# Patient Record
Sex: Female | Born: 2000 | Race: Black or African American | Hispanic: No | Marital: Single | Smoking: Never smoker
Health system: Southern US, Community
[De-identification: ages and names within clinical notes are randomized; demographics above are authoritative.]

## PROBLEM LIST (undated history)

## (undated) ENCOUNTER — Inpatient Hospital Stay (HOSPITAL_COMMUNITY): Payer: Self-pay

## (undated) DIAGNOSIS — Z789 Other specified health status: Secondary | ICD-10-CM

## (undated) HISTORY — DX: Other specified health status: Z78.9

---
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
## 2018-01-10 ENCOUNTER — Ambulatory Visit (INDEPENDENT_AMBULATORY_CARE_PROVIDER_SITE_OTHER): Payer: BLUE CROSS/BLUE SHIELD | Admitting: Obstetrics and Gynecology

## 2018-01-10 VITALS — BP 123/85 | HR 98 | Wt 193.5 lb

## 2018-01-10 DIAGNOSIS — Z3480 Encounter for supervision of other normal pregnancy, unspecified trimester: Secondary | ICD-10-CM

## 2018-01-10 DIAGNOSIS — Z23 Encounter for immunization: Secondary | ICD-10-CM

## 2018-01-10 DIAGNOSIS — Z3483 Encounter for supervision of other normal pregnancy, third trimester: Secondary | ICD-10-CM

## 2018-01-10 NOTE — Progress Notes (Signed)
   PRENATAL VISIT NOTE  Subjective:  Wendy Aguilar is a 17 y.o. G2P1001 at 7378w3d being seen today for ongoing prenatal care.  She is currently monitored for the following issues for this low-risk pregnancy and has Supervision of other normal pregnancy, antepartum; History of cesarean section; Encounter for fetal anatomic survey; and Late prenatal care affecting pregnancy in second trimester on their problem list.  Patient reports no complaints.  Contractions: Irritability. Vag. Bleeding: None.  Movement: Present. Denies leaking of fluid.   The following portions of the patient's history were reviewed and updated as appropriate: allergies, current medications, past family history, past medical history, past social history, past surgical history and problem list. Problem list updated.  Objective:   Vitals:   01/10/18 1554  BP: 123/85  Pulse: 98  Weight: 193 lb 8 oz (87.8 kg)    Fetal Status: Fetal Heart Rate (bpm): 140   Movement: Present     General:  Alert, oriented and cooperative. Patient is in no acute distress.  Skin: Skin is warm and dry. No rash noted.   Cardiovascular: Normal heart rate noted  Respiratory: Normal respiratory effort, no problems with respiration noted  Abdomen: Soft, gravid, appropriate for gestational age.  Pain/Pressure: Present     Pelvic: Cervical exam deferred        Extremities: Normal range of motion.  Edema: None  Mental Status: Normal mood and affect. Normal behavior. Normal judgment and thought content.   Assessment and Plan:  Pregnancy: G2P1001 at 1378w3d  1. Supervision of other normal pregnancy, antepartum  - Doing well today   2. Need for tetanus, diphtheria, and acellular pertussis (Tdap) vaccine in patient of adolescent age or older  - Tdap vaccine greater than or equal to 7yo IM  There are no diagnoses linked to this encounter. Preterm labor symptoms and general obstetric precautions including but not limited to vaginal bleeding,  contractions, leaking of fluid and fetal movement were reviewed in detail with the patient. Please refer to After Visit Summary for other counseling recommendations.  No follow-ups on file.  Future Appointments  Date Time Provider Department Center  01/24/2018  8:15 AM Marvetta GibbonsBurleson, Brand [facility]aleserri L, NP [facility]    Venia CarbonJennifer Rasch, NP

## 2018-01-10 NOTE — Progress Notes (Signed)
CSW A. Linton Rump met privately with Mob to discuss parenting, social and contraception options. MOB reports family is very supportive. MOB reports she currently employed and intends to work up till her due date. CSW A. Mohmmad Saleeby arranged [place] appt for MOB for 01/18/18 at [place]. Mother intends to use OCP as contraception option. CSW provided information on PP IUD and nexaplon

## 2018-01-10 NOTE — Progress Notes (Signed)
   PRENATAL VISIT NOTE  Subjective:  Wendy Aguilar is a 17 y.o. G2P1001 at 7965w3d being seen today for ongoing prenatal care.  She is currently monitored for the following issues for this low-risk pregnancy and has Supervision of other normal pregnancy, antepartum; History of cesarean section; Encounter for fetal anatomic survey; and Late prenatal care affecting pregnancy in second trimester on their problem list.  Patient reports lower abdominal pressure. She confirms frequent fetal activity. She denies any lower or upper abdominal pain, cramping, vaginal discharge or bleeding. Pt. Denies denies episodes of syncope, shortness of breath, chest pain, or palpitations.  Contractions: Irritability. Vag. Bleeding: None.  Movement: Present. Denies leaking of fluid.   The following portions of the patient's history were reviewed and updated as appropriate: allergies, current medications, past family history, past medical history, past social history, past surgical history and problem list. Problem list updated.  Objective:   Vitals:   01/10/18 1554  BP: 123/85  Pulse: 98  Weight: 193 lb 8 oz (87.8 kg)    Fetal Status: Fetal Heart Rate (bpm): 140 Fundal Height: 33 cm(Simultaneous filing. User may not have seen previous data.) Movement: Present     General:  Alert, oriented and cooperative. Patient is in no acute distress.  Skin: Skin is warm and dry. No rash noted.   Cardiovascular: Irregular BP noted, exacerbated during inhalation.  Respiratory: Normal respiratory effort, no problems with respiration noted  Abdomen: Soft, gravid, appropriate for gestational age.  Pain/Pressure: Present     Pelvic: Cervical exam deferred        Extremities: Normal range of motion.  Edema: None  Mental Status: Normal mood and affect. Normal behavior. Normal judgment and thought content.   Assessment and Plan:  Pregnancy: G2P1001 at 5665w3d  1. Supervision of other normal pregnancy, antepartum   2. Need  for tetanus, diphtheria, and acellular pertussis (Tdap) vaccine in patient of adolescent age or older  - Tdap vaccine greater than or equal to 7yo IM  Preterm labor symptoms and general obstetric precautions including but not limited to vaginal bleeding, contractions, leaking of fluid and fetal movement were reviewed in detail with the patient. Please refer to After Visit Summary for other counseling recommendations.  Return in about 2 weeks (around 01/24/2018).  Future Appointments  Date Time Provider Department Center  01/24/2018  8:15 AM Burleson, Brand Maleserri L, NP [facility]    Venia CarbonJennifer Rasch, NP   FNP student

## 2018-01-10 NOTE — Patient Instructions (Signed)
Tdap Vaccine (Tetanus, Diphtheria and Pertussis): What You Need to Know 1. Why get vaccinated? Tetanus, diphtheria and pertussis are very serious diseases. Tdap vaccine can protect us from these diseases. And, Tdap vaccine given to pregnant women can protect newborn babies against pertussis. TETANUS (Lockjaw) is rare in the [place] today. It causes painful muscle tightening and stiffness, usually all over the body.  It can lead to tightening of muscles in the head and neck so you can't open your mouth, swallow, or sometimes even breathe. Tetanus kills about 1 out of 10 people who are infected even after receiving the best medical care.  DIPHTHERIA is also rare in the [place] today. It can cause a thick coating to form in the back of the throat.  It can lead to breathing problems, heart failure, paralysis, and death.  PERTUSSIS (Whooping Cough) causes severe coughing spells, which can cause difficulty breathing, vomiting and disturbed sleep.  It can also lead to weight loss, incontinence, and rib fractures. Up to 2 in 100 adolescents and 5 in 100 adults with pertussis are hospitalized or have complications, which could include pneumonia or death.  These diseases are caused by bacteria. Diphtheria and pertussis are spread from person to person through secretions from coughing or sneezing. Tetanus enters the body through cuts, scratches, or wounds. Before vaccines, as many as 200,000 cases of diphtheria, 200,000 cases of pertussis, and hundreds of cases of tetanus, were reported in the [place] each year. Since vaccination began, reports of cases for tetanus and diphtheria have dropped by about 99% and for pertussis by about 80%. 2. Tdap vaccine Tdap vaccine can protect adolescents and adults from tetanus, diphtheria, and pertussis. One dose of Tdap is routinely given at age 11 or 12. People who did not get Tdap at that age should get it as soon as possible. Tdap is especially  important for healthcare professionals and anyone having close contact with a baby younger than 12 months. Pregnant women should get a dose of Tdap during every pregnancy, to protect the newborn from pertussis. Infants are most at risk for severe, life-threatening complications from pertussis. Another vaccine, called Td, protects against tetanus and diphtheria, but not pertussis. A Td booster should be given every 10 years. Tdap may be given as one of these boosters if you have never gotten Tdap before. Tdap may also be given after a severe cut or burn to prevent tetanus infection. Your doctor or the person giving you the vaccine can give you more information. Tdap may safely be given at the same time as other vaccines. 3. Some people should not get this vaccine  A person who has ever had a life-threatening allergic reaction after a previous dose of any diphtheria, tetanus or pertussis containing vaccine, OR has a severe allergy to any part of this vaccine, should not get Tdap vaccine. Tell the person giving the vaccine about any severe allergies.  Anyone who had coma or long repeated seizures within 7 days after a childhood dose of DTP or DTaP, or a previous dose of Tdap, should not get Tdap, unless a cause other than the vaccine was found. They can still get Td.  Talk to your doctor if you: ? have seizures or another nervous system problem, ? had severe pain or swelling after any vaccine containing diphtheria, tetanus or pertussis, ? ever had a condition called Guillain-Barr Syndrome (GBS), ? aren't feeling well on the day the shot is scheduled. 4. Risks With any medicine, including   vaccines, there is a chance of side effects. These are usually mild and go away on their own. Serious reactions are also possible but are rare. Most people who get Tdap vaccine do not have any problems with it. Mild problems following Tdap: (Did not interfere with activities)  Pain where the shot was given (about  3 in 4 adolescents or 2 in 3 adults)  Redness or swelling where the shot was given (about 1 person in 5)  Mild fever of at least 100.4F (up to about 1 in 25 adolescents or 1 in 100 adults)  Headache (about 3 or 4 people in 10)  Tiredness (about 1 person in 3 or 4)  Nausea, vomiting, diarrhea, stomach ache (up to 1 in 4 adolescents or 1 in 10 adults)  Chills, sore joints (about 1 person in 10)  Body aches (about 1 person in 3 or 4)  Rash, swollen glands (uncommon)  Moderate problems following Tdap: (Interfered with activities, but did not require medical attention)  Pain where the shot was given (up to 1 in 5 or 6)  Redness or swelling where the shot was given (up to about 1 in 16 adolescents or 1 in 12 adults)  Fever over 102F (about 1 in 100 adolescents or 1 in 250 adults)  Headache (about 1 in 7 adolescents or 1 in 10 adults)  Nausea, vomiting, diarrhea, stomach ache (up to 1 or 3 people in 100)  Swelling of the entire arm where the shot was given (up to about 1 in 500).  Severe problems following Tdap: (Unable to perform usual activities; required medical attention)  Swelling, severe pain, bleeding and redness in the arm where the shot was given (rare).  Problems that could happen after any vaccine:  People sometimes faint after a medical procedure, including vaccination. Sitting or lying down for about 15 minutes can help prevent fainting, and injuries caused by a fall. Tell your doctor if you feel dizzy, or have vision changes or ringing in the ears.  Some people get severe pain in the shoulder and have difficulty moving the arm where a shot was given. This happens very rarely.  Any medication can cause a severe allergic reaction. Such reactions from a vaccine are very rare, estimated at fewer than 1 in a million doses, and would happen within a few minutes to a few hours after the vaccination. As with any medicine, there is a very remote chance of a vaccine  causing a serious injury or death. The safety of vaccines is always being monitored. For more information, visit: www.cdc.gov/vaccinesafety/ 5. What if there is a serious problem? What should I look for? Look for anything that concerns you, such as signs of a severe allergic reaction, very high fever, or unusual behavior. Signs of a severe allergic reaction can include hives, swelling of the face and throat, difficulty breathing, a fast heartbeat, dizziness, and weakness. These would usually start a few minutes to a few hours after the vaccination. What should I do?  If you think it is a severe allergic reaction or other emergency that can't wait, call 9-1-1 or get the person to the nearest hospital. Otherwise, call your doctor.  Afterward, the reaction should be reported to the Vaccine Adverse Event Reporting System (VAERS). Your doctor might file this report, or you can do it yourself through the VAERS web site at www.vaers.hhs.gov, or by calling 1-800-822-7967. ? VAERS does not give medical advice. 6. The National Vaccine Injury Compensation Program The National   Vaccine Injury Compensation Program (VICP) is a federal program that was created to compensate people who may have been injured by certain vaccines. Persons who believe they may have been injured by a vaccine can learn about the program and about filing a claim by calling 1-800-338-2382 or visiting the VICP website at www.hrsa.gov/vaccinecompensation. There is a time limit to file a claim for compensation. 7. How can I learn more?  Ask your doctor. He or she can give you the vaccine package insert or suggest other sources of information.  Call your local or state health department.  Contact the Centers for Disease Control and Prevention (CDC): ? Call 1-800-232-4636 (1-800-CDC-INFO) or ? Visit CDC's website at www.cdc.gov/vaccines CDC Tdap Vaccine VIS (09/25/13) This information is not intended to replace advice given to you by your  health care provider. Make sure you discuss any questions you have with your health care provider. Document Released: 01/18/2012 Document Revised: 04/08/2016 Document Reviewed: 04/08/2016 Elsevier Interactive Patient Education  2017 Elsevier Inc.  

## 2018-01-24 ENCOUNTER — Encounter: Payer: BLUE CROSS/BLUE SHIELD | Admitting: Nurse Practitioner

## 2018-01-24 ENCOUNTER — Encounter: Payer: Self-pay | Admitting: Nurse Practitioner

## 2018-01-25 ENCOUNTER — Ambulatory Visit (INDEPENDENT_AMBULATORY_CARE_PROVIDER_SITE_OTHER): Payer: BLUE CROSS/BLUE SHIELD | Admitting: Medical

## 2018-01-25 ENCOUNTER — Encounter: Payer: Self-pay | Admitting: Medical

## 2018-01-25 VITALS — BP 115/61 | HR 79 | Wt 200.0 lb

## 2018-01-25 DIAGNOSIS — Z3480 Encounter for supervision of other normal pregnancy, unspecified trimester: Secondary | ICD-10-CM

## 2018-01-25 DIAGNOSIS — Z98891 History of uterine scar from previous surgery: Secondary | ICD-10-CM

## 2018-01-25 NOTE — Progress Notes (Signed)
   PRENATAL VISIT NOTE  Subjective:  Wendy Aguilar is a 17 y.o. G2P1001 at 4455w4d being seen today for ongoing prenatal care.  She is currently monitored for the following issues for this low-risk pregnancy and has Supervision of other normal pregnancy, antepartum; History of cesarean section; Encounter for fetal anatomic survey; and Late prenatal care affecting pregnancy in second trimester on their problem list.  Patient reports occasional contractions.  Contractions: Irritability. Vag. Bleeding: None.  Movement: Present. Denies leaking of fluid.   The following portions of the patient's history were reviewed and updated as appropriate: allergies, current medications, past family history, past medical history, past social history, past surgical history and problem list. Problem list updated.  Objective:   Vitals:   01/25/18 1627  BP: 115/61  Pulse: 79  Weight: 200 lb (90.7 kg)    Fetal Status: Fetal Heart Rate (bpm): 156 Fundal Height: 34 cm Movement: Present     General:  Alert, oriented and cooperative. Patient is in no acute distress.  Skin: Skin is warm and dry. No rash noted.   Cardiovascular: Normal heart rate noted  Respiratory: Normal respiratory effort, no problems with respiration noted  Abdomen: Soft, gravid, appropriate for gestational age.  Pain/Pressure: Present     Pelvic: Cervical exam deferred        Extremities: Normal range of motion.  Edema: None  Mental Status: Normal mood and affect. Normal behavior. Normal judgment and thought content.   Assessment and Plan:  Pregnancy: G2P1001 at 3755w4d  1. Supervision of other normal pregnancy, antepartum  2. History of cesarean section - Plan for TOLAC, consent signed  Preterm labor symptoms and general obstetric precautions including but not limited to vaginal bleeding, contractions, leaking of fluid and fetal movement were reviewed in detail with the patient. Please refer to After Visit Summary for other  counseling recommendations.  Return in about 2 weeks (around 02/08/2018) for LOB.  Vonzella NippleJulie Gerald Kuehl, PA-C

## 2018-01-25 NOTE — Patient Instructions (Signed)
Research childbirth classes and hospital preregistration at [email or web address]  Fetal Movement Counts Patient Name: ________________________________________________ Patient Due Date: ____________________ What is a fetal movement count? A fetal movement count is the number of times that you feel your baby move during a certain amount of time. This may also be called a fetal kick count. A fetal movement count is recommended for every pregnant woman. You may be asked to start counting fetal movements as early as week 28 of your pregnancy. Pay attention to when your baby is most active. You may notice your baby's sleep and wake cycles. You may also notice things that make your baby move more. You should do a fetal movement count:  When your baby is normally most active.  At the same time each day.  A good time to count movements is while you are resting, after having something to eat and drink. How do I count fetal movements? 1. Find a quiet, comfortable area. Sit, or lie down on your side. 2. Write down the date, the start time and stop time, and the number of movements that you felt between those two times. Take this information with you to your health care visits. 3. For 2 hours, count kicks, flutters, swishes, rolls, and jabs. You should feel at least 10 movements during 2 hours. 4. You may stop counting after you have felt 10 movements. 5. If you do not feel 10 movements in 2 hours, have something to eat and drink. Then, keep resting and counting for 1 hour. If you feel at least 4 movements during that hour, you may stop counting. Contact a health care provider if:  You feel fewer than 4 movements in 2 hours.  Your baby is not moving like he or she usually does. Date: ____________ Start time: ____________ Stop time: ____________ Movements: ____________ Date: ____________ Start time: ____________ Stop time: ____________ Movements: ____________ Date: ____________ Start time: ____________  Stop time: ____________ Movements: ____________ Date: ____________ Start time: ____________ Stop time: ____________ Movements: ____________ Date: ____________ Start time: ____________ Stop time: ____________ Movements: ____________ Date: ____________ Start time: ____________ Stop time: ____________ Movements: ____________ Date: ____________ Start time: ____________ Stop time: ____________ Movements: ____________ Date: ____________ Start time: ____________ Stop time: ____________ Movements: ____________ Date: ____________ Start time: ____________ Stop time: ____________ Movements: ____________ This information is not intended to replace advice given to you by your health care provider. Make sure you discuss any questions you have with your health care provider. Document Released: 08/18/2006 Document Revised: 03/17/2016 Document Reviewed: 08/28/2015 Elsevier Interactive Patient Education  2018 [employer]  Braxton Hicks Contractions Contractions of the uterus can occur throughout pregnancy, but they are not always a sign that you are in labor. You may have practice contractions called Braxton Hicks contractions. These false labor contractions are sometimes confused with true labor. What are Braxton Hicks contractions? Braxton Hicks contractions are tightening movements that occur in the muscles of the uterus before labor. Unlike true labor contractions, these contractions do not result in opening (dilation) and thinning of the cervix. Toward the end of pregnancy (32-34 weeks), Braxton Hicks contractions can happen more often and may become stronger. These contractions are sometimes difficult to tell apart from true labor because they can be very uncomfortable. You should not feel embarrassed if you go to the hospital with false labor. Sometimes, the only way to tell if you are in true labor is for your health care provider to look for changes in the cervix. The health care provider will   do a physical  exam and may monitor your contractions. If you are not in true labor, the exam should show that your cervix is not dilating and your water has not broken. If there are other health problems associated with your pregnancy, it is completely safe for you to be sent home with false labor. You may continue to have Braxton Hicks contractions until you go into true labor. How to tell the difference between true labor and false labor True labor  Contractions last 30-70 seconds.  Contractions become very regular.  Discomfort is usually felt in the top of the uterus, and it spreads to the lower abdomen and low back.  Contractions do not go away with walking.  Contractions usually become more intense and increase in frequency.  The cervix dilates and gets thinner. False labor  Contractions are usually shorter and not as strong as true labor contractions.  Contractions are usually irregular.  Contractions are often felt in the front of the lower abdomen and in the groin.  Contractions may go away when you walk around or change positions while lying down.  Contractions get weaker and are shorter-lasting as time goes on.  The cervix usually does not dilate or become thin. Follow these instructions at home:  Take over-the-counter and prescription medicines only as told by your health care provider.  Keep up with your usual exercises and follow other instructions from your health care provider.  Eat and drink lightly if you think you are going into labor.  If Braxton Hicks contractions are making you uncomfortable: ? Change your position from lying down or resting to walking, or change from walking to resting. ? Sit and rest in a tub of warm water. ? Drink enough fluid to keep your urine pale yellow. Dehydration may cause these contractions. ? Do slow and deep breathing several times an hour.  Keep all follow-up prenatal visits as told by your health care provider. This is  important. Contact a health care provider if:  You have a fever.  You have continuous pain in your abdomen. Get help right away if:  Your contractions become stronger, more regular, and closer together.  You have fluid leaking or gushing from your vagina.  You pass blood-tinged mucus (bloody show).  You have bleeding from your vagina.  You have low back pain that you never had before.  You feel your baby's head pushing down and causing pelvic pressure.  Your baby is not moving inside you as much as it used to. Summary  Contractions that occur before labor are called Braxton Hicks contractions, false labor, or practice contractions.  Braxton Hicks contractions are usually shorter, weaker, farther apart, and less regular than true labor contractions. True labor contractions usually become progressively stronger and regular and they become more frequent.  Manage discomfort from Braxton Hicks contractions by changing position, resting in a warm bath, drinking plenty of water, or practicing deep breathing. This information is not intended to replace advice given to you by your health care provider. Make sure you discuss any questions you have with your health care provider. Document Released: 12/02/2016 Document Revised: 12/02/2016 Document Reviewed: 12/02/2016 Elsevier Interactive Patient Education  2018 [employer] Inc.    

## 2018-02-08 ENCOUNTER — Inpatient Hospital Stay (HOSPITAL_COMMUNITY)
Admission: AD | Admit: 2018-02-08 | Discharge: 2018-02-08 | Disposition: A | Discharge status: Home / Self Care | Payer: BLUE CROSS/BLUE SHIELD | Source: Ambulatory Visit | Attending: Obstetrics and Gynecology | Admitting: Obstetrics and Gynecology

## 2018-02-08 ENCOUNTER — Telehealth: Payer: Self-pay | Admitting: Licensed Clinical Social Worker

## 2018-02-08 ENCOUNTER — Encounter (HOSPITAL_COMMUNITY): Payer: Self-pay | Admitting: *Deleted

## 2018-02-08 DIAGNOSIS — N898 Other specified noninflammatory disorders of vagina: Secondary | ICD-10-CM | POA: Insufficient documentation

## 2018-02-08 DIAGNOSIS — Z3A35 35 weeks gestation of pregnancy: Secondary | ICD-10-CM | POA: Diagnosis not present

## 2018-02-08 DIAGNOSIS — O26893 Other specified pregnancy related conditions, third trimester: Secondary | ICD-10-CM | POA: Diagnosis not present

## 2018-02-08 DIAGNOSIS — Z0371 Encounter for suspected problem with amniotic cavity and membrane ruled out: Secondary | ICD-10-CM

## 2018-02-08 LAB — URINALYSIS, ROUTINE W REFLEX MICROSCOPIC
BILIRUBIN URINE: NEGATIVE
Glucose, UA: NEGATIVE mg/dL
Hgb urine dipstick: NEGATIVE
KETONES UR: 20 mg/dL — AB
Leukocytes, UA: NEGATIVE
NITRITE: NEGATIVE
Protein, ur: NEGATIVE mg/dL
SPECIFIC GRAVITY, URINE: 1.017 (ref 1.005–1.030)
pH: 6 (ref 5.0–8.0)

## 2018-02-08 LAB — AMNISURE RUPTURE OF MEMBRANE (ROM) NOT AT ARMC: AMNISURE: NEGATIVE

## 2018-02-08 NOTE — Discharge Instructions (Signed)
Braxton Hicks Contractions °Contractions of the uterus can occur throughout pregnancy, but they are not always a sign that you are in labor. You may have practice contractions called Braxton Hicks contractions. These false labor contractions are sometimes confused with true labor. °What are Braxton Hicks contractions? °Braxton Hicks contractions are tightening movements that occur in the muscles of the uterus before labor. Unlike true labor contractions, these contractions do not result in opening (dilation) and thinning of the cervix. Toward the end of pregnancy (32-34 weeks), Braxton Hicks contractions can happen more often and may become stronger. These contractions are sometimes difficult to tell apart from true labor because they can be very uncomfortable. You should not feel embarrassed if you go to the hospital with false labor. °Sometimes, the only way to tell if you are in true labor is for your health care provider to look for changes in the cervix. The health care provider will do a physical exam and may monitor your contractions. If you are not in true labor, the exam should show that your cervix is not dilating and your water has not broken. °If there are other health problems associated with your pregnancy, it is completely safe for you to be sent home with false labor. You may continue to have Braxton Hicks contractions until you go into true labor. °How to tell the difference between true labor and false labor °True labor °· Contractions last 30-70 seconds. °· Contractions become very regular. °· Discomfort is usually felt in the top of the uterus, and it spreads to the lower abdomen and low back. °· Contractions do not go away with walking. °· Contractions usually become more intense and increase in frequency. °· The cervix dilates and gets thinner. °False labor °· Contractions are usually shorter and not as strong as true labor contractions. °· Contractions are usually irregular. °· Contractions  are often felt in the front of the lower abdomen and in the groin. °· Contractions may go away when you walk around or change positions while lying down. °· Contractions get weaker and are shorter-lasting as time goes on. °· The cervix usually does not dilate or become thin. °Follow these instructions at home: °· Take over-the-counter and prescription medicines only as told by your health care provider. °· Keep up with your usual exercises and follow other instructions from your health care provider. °· Eat and drink lightly if you think you are going into labor. °· If Braxton Hicks contractions are making you uncomfortable: °? Change your position from lying down or resting to walking, or change from walking to resting. °? Sit and rest in a tub of warm water. °? Drink enough fluid to keep your urine pale yellow. Dehydration may cause these contractions. °? Do slow and deep breathing several times an hour. °· Keep all follow-up prenatal visits as told by your health care provider. This is important. °Contact a health care provider if: °· You have a fever. °· You have continuous pain in your abdomen. °Get help right away if: °· Your contractions become stronger, more regular, and closer together. °· You have fluid leaking or gushing from your vagina. °· You pass blood-tinged mucus (bloody show). °· You have bleeding from your vagina. °· You have low back pain that you never had before. °· You feel your baby’s head pushing down and causing pelvic pressure. °· Your baby is not moving inside you as much as it used to. °Summary °· Contractions that occur before labor are called Braxton   Hicks contractions, false labor, or practice contractions. °· Braxton Hicks contractions are usually shorter, weaker, farther apart, and less regular than true labor contractions. True labor contractions usually become progressively stronger and regular and they become more frequent. °· Manage discomfort from Braxton Hicks contractions by  changing position, resting in a warm bath, drinking plenty of water, or practicing deep breathing. °This information is not intended to replace advice given to you by your health care provider. Make sure you discuss any questions you have with your health care provider. °Document Released: 12/02/2016 Document Revised: 12/02/2016 Document Reviewed: 12/02/2016 °Elsevier Interactive Patient Education © 2018 [employer] Inc. ° °Fetal Movement Counts °Patient Name: ________________________________________________ Patient Due Date: ____________________ °What is a fetal movement count? °A fetal movement count is the number of times that you feel your baby move during a certain amount of time. This may also be called a fetal kick count. A fetal movement count is recommended for every pregnant woman. You may be asked to start counting fetal movements as early as week 28 of your pregnancy. °Pay attention to when your baby is most active. You may notice your baby's sleep and wake cycles. You may also notice things that make your baby move more. You should do a fetal movement count: °· When your baby is normally most active. °· At the same time each day. ° °A good time to count movements is while you are resting, after having something to eat and drink. °How do I count fetal movements? °1. Find a quiet, comfortable area. Sit, or lie down on your side. °2. Write down the date, the start time and stop time, and the number of movements that you felt between those two times. Take this information with you to your health care visits. °3. For 2 hours, count kicks, flutters, swishes, rolls, and jabs. You should feel at least 10 movements during 2 hours. °4. You may stop counting after you have felt 10 movements. °5. If you do not feel 10 movements in 2 hours, have something to eat and drink. Then, keep resting and counting for 1 hour. If you feel at least 4 movements during that hour, you may stop counting. °Contact a health care  provider if: °· You feel fewer than 4 movements in 2 hours. °· Your baby is not moving like he or she usually does. °Date: ____________ Start time: ____________ Stop time: ____________ Movements: ____________ °Date: ____________ Start time: ____________ Stop time: ____________ Movements: ____________ °Date: ____________ Start time: ____________ Stop time: ____________ Movements: ____________ °Date: ____________ Start time: ____________ Stop time: ____________ Movements: ____________ °Date: ____________ Start time: ____________ Stop time: ____________ Movements: ____________ °Date: ____________ Start time: ____________ Stop time: ____________ Movements: ____________ °Date: ____________ Start time: ____________ Stop time: ____________ Movements: ____________ °Date: ____________ Start time: ____________ Stop time: ____________ Movements: ____________ °Date: ____________ Start time: ____________ Stop time: ____________ Movements: ____________ °This information is not intended to replace advice given to you by your health care provider. Make sure you discuss any questions you have with your health care provider. °Document Released: 08/18/2006 Document Revised: 03/17/2016 Document Reviewed: 08/28/2015 °Elsevier Interactive Patient Education © 2018 Else[employer] Inc. ° °

## 2018-02-08 NOTE — MAU Note (Signed)
Pt started having regular contractions while at work and when she went to the restroom she said she had some clear fluid come out.  States its not gushing but still feels a little bit.  Denies VB.

## 2018-02-08 NOTE — Telephone Encounter (Signed)
Phone busy.

## 2018-02-08 NOTE — MAU Provider Note (Addendum)
  S: Wendy Aguilar is a 17 y.o. G2P1001 at 3170w4d  who presents to [facility]U today complaining of leaking of fluid since this afternoon. She denies vaginal bleeding. She endorses occasional contractions. She reports normal fetal movement.    O: BP 129/66   Pulse 84   Temp 98.6 F (37 C) (Oral)   Resp 16   LMP 06/04/2017 (Approximate)   SpO2 100%  GENERAL: Well-developed, well-nourished female in no acute distress.  HEAD: Normocephalic, atraumatic.  CHEST: Normal effort of breathing, regular heart rate ABDOMEN: Soft, nontender, gravid   Cervical exam:  Dilation: Closed Effacement (%): 50 Station: Ballotable Exam by:: Judeth HornErin Kristilyn Coltrane, NP   Fetal Monitoring: Baseline: 135 Variability: moderate Accelerations: 15x15 Decelerations: none Contractions: irregular  Results for orders placed or performed during the hospital encounter of 02/08/18 (from the past 24 hour(s))  Urinalysis, Routine w reflex microscopic     Status: Abnormal   Collection Time: 02/08/18  7:25 PM  Result Value Ref Range   Color, Urine YELLOW YELLOW   APPearance CLEAR CLEAR   Specific Gravity, Urine 1.017 1.005 - 1.030   pH 6.0 5.0 - 8.0   Glucose, UA NEGATIVE NEGATIVE mg/dL   Hgb urine dipstick NEGATIVE NEGATIVE   Bilirubin Urine NEGATIVE NEGATIVE   Ketones, ur 20 (A) NEGATIVE mg/dL   Protein, ur NEGATIVE NEGATIVE mg/dL   Nitrite NEGATIVE NEGATIVE   Leukocytes, UA NEGATIVE NEGATIVE  [facility]     Status: None   Collection Time: 02/08/18  8:00 PM  Result Value Ref Range   Amnisure ROM NEGATIVE      A: SIUP at 6870w4d  Membranes intact  P: Discharge home  Keep ob appt tomorrow Discussed reasons to return to MAU  Judeth HornLawrence, Dejai Schubach, NP 02/08/2018 8:51 PM

## 2018-02-09 ENCOUNTER — Other Ambulatory Visit (HOSPITAL_COMMUNITY)
Admission: RE | Admit: 2018-02-09 | Discharge: 2018-02-09 | Disposition: A | Discharge status: Home / Self Care | Payer: BLUE CROSS/BLUE SHIELD | Source: Ambulatory Visit | Attending: Nurse Practitioner | Admitting: Nurse Practitioner

## 2018-02-09 ENCOUNTER — Ambulatory Visit (INDEPENDENT_AMBULATORY_CARE_PROVIDER_SITE_OTHER): Payer: BLUE CROSS/BLUE SHIELD | Admitting: Nurse Practitioner

## 2018-02-09 VITALS — BP 124/75 | HR 71 | Wt 204.2 lb

## 2018-02-09 DIAGNOSIS — Z3483 Encounter for supervision of other normal pregnancy, third trimester: Secondary | ICD-10-CM | POA: Diagnosis not present

## 2018-02-09 DIAGNOSIS — Z3480 Encounter for supervision of other normal pregnancy, unspecified trimester: Secondary | ICD-10-CM | POA: Diagnosis present

## 2018-02-09 DIAGNOSIS — Z98891 History of uterine scar from previous surgery: Secondary | ICD-10-CM

## 2018-02-09 LAB — OB RESULTS CONSOLE GBS: GBS: NEGATIVE

## 2018-02-09 NOTE — Patient Instructions (Signed)
Braxton Hicks Contractions °Contractions of the uterus can occur throughout pregnancy, but they are not always a sign that you are in labor. You may have practice contractions called Braxton Hicks contractions. These false labor contractions are sometimes confused with true labor. °What are Braxton Hicks contractions? °Braxton Hicks contractions are tightening movements that occur in the muscles of the uterus before labor. Unlike true labor contractions, these contractions do not result in opening (dilation) and thinning of the cervix. Toward the end of pregnancy (32-34 weeks), Braxton Hicks contractions can happen more often and may become stronger. These contractions are sometimes difficult to tell apart from true labor because they can be very uncomfortable. You should not feel embarrassed if you go to the hospital with false labor. °Sometimes, the only way to tell if you are in true labor is for your health care provider to look for changes in the cervix. The health care provider will do a physical exam and may monitor your contractions. If you are not in true labor, the exam should show that your cervix is not dilating and your water has not broken. °If there are other health problems associated with your pregnancy, it is completely safe for you to be sent home with false labor. You may continue to have Braxton Hicks contractions until you go into true labor. °How to tell the difference between true labor and false labor °True labor °· Contractions last 30-70 seconds. °· Contractions become very regular. °· Discomfort is usually felt in the top of the uterus, and it spreads to the lower abdomen and low back. °· Contractions do not go away with walking. °· Contractions usually become more intense and increase in frequency. °· The cervix dilates and gets thinner. °False labor °· Contractions are usually shorter and not as strong as true labor contractions. °· Contractions are usually irregular. °· Contractions  are often felt in the front of the lower abdomen and in the groin. °· Contractions may go away when you walk around or change positions while lying down. °· Contractions get weaker and are shorter-lasting as time goes on. °· The cervix usually does not dilate or become thin. °Follow these instructions at home: °· Take over-the-counter and prescription medicines only as told by your health care provider. °· Keep up with your usual exercises and follow other instructions from your health care provider. °· Eat and drink lightly if you think you are going into labor. °· If Braxton Hicks contractions are making you uncomfortable: °? Change your position from lying down or resting to walking, or change from walking to resting. °? Sit and rest in a tub of warm water. °? Drink enough fluid to keep your urine pale yellow. Dehydration may cause these contractions. °? Do slow and deep breathing several times an hour. °· Keep all follow-up prenatal visits as told by your health care provider. This is important. °Contact a health care provider if: °· You have a fever. °· You have continuous pain in your abdomen. °Get help right away if: °· Your contractions become stronger, more regular, and closer together. °· You have fluid leaking or gushing from your vagina. °· You pass blood-tinged mucus (bloody show). °· You have bleeding from your vagina. °· You have low back pain that you never had before. °· You feel your baby’s head pushing down and causing pelvic pressure. °· Your baby is not moving inside you as much as it used to. °Summary °· Contractions that occur before labor are called Braxton   Hicks contractions, false labor, or practice contractions. °· Braxton Hicks contractions are usually shorter, weaker, farther apart, and less regular than true labor contractions. True labor contractions usually become progressively stronger and regular and they become more frequent. °· Manage discomfort from Braxton Hicks contractions by  changing position, resting in a warm bath, drinking plenty of water, or practicing deep breathing. °This information is not intended to replace advice given to you by your health care provider. Make sure you discuss any questions you have with your health care provider. °Document Released: 12/02/2016 Document Revised: 12/02/2016 Document Reviewed: 12/02/2016 °Elsevier Interactive Patient Education © 2018 [employer] Inc. ° °

## 2018-02-09 NOTE — Progress Notes (Signed)
    Subjective:  Wendy Aguilar is a 17 y.o. G2P1001 at 6073w5d being seen today for ongoing prenatal care.  She is currently monitored for the following issues for this low-risk pregnancy and has Supervision of other normal pregnancy, antepartum; History of cesarean section; Encounter for fetal anatomic survey; and Late prenatal care affecting pregnancy in second trimester on their problem list.  Patient reports no complaints.  Contractions: Irritability. Vag. Bleeding: None.  Movement: Present. Denies leaking of fluid.   The following portions of the patient's history were reviewed and updated as appropriate: allergies, current medications, past family history, past medical history, past social history, past surgical history and problem list. Problem list updated.  Objective:   Vitals:   02/09/18 1637  BP: 124/75  Pulse: 71  Weight: 204 lb 3.2 oz (92.6 kg)    Fetal Status: Fetal Heart Rate (bpm): 146 Fundal Height: 38 cm Movement: Present     General:  Alert, oriented and cooperative. Patient is in no acute distress.  Skin: Skin is warm and dry. No rash noted.   Cardiovascular: Normal heart rate noted  Respiratory: Normal respiratory effort, no problems with respiration noted  Abdomen: Soft, gravid, appropriate for gestational age. Pain/Pressure: Present     Pelvic:  Cervical exam deferred        Extremities: Normal range of motion.  Edema: None  Mental Status: Normal mood and affect. Normal behavior. Normal judgment and thought content.   Urinalysis:      Assessment and Plan:  Pregnancy: G2P1001 at 6673w5d  1. Supervision of other normal pregnancy, antepartum Vaginal swabs done, no cervical exam - GC/Chlamydia probe amp ([place]not at [facility] - Culture, beta strep (group b only)  2. History of cesarean section Has consent for VBAC done  Preterm labor symptoms and general obstetric precautions including but not limited to vaginal bleeding, contractions, leaking of fluid  and fetal movement were reviewed in detail with the patient. Please refer to After Visit Summary for other counseling recommendations.  Return in about 1 week (around 02/16/2018).  Nolene BernheimERRI Sanah Kraska, RN, MSN, NP-BC Nurse Practitioner, [facility], [facility] 02/09/2018 5:15 PM

## 2018-02-13 LAB — CULTURE, BETA STREP (GROUP B ONLY): Strep Gp B Culture: NEGATIVE

## 2018-02-13 LAB — GC/CHLAMYDIA PROBE AMP (~~LOC~~) NOT AT ARMC
CHLAMYDIA, DNA PROBE: NEGATIVE
Neisseria Gonorrhea: NEGATIVE

## 2018-02-17 ENCOUNTER — Ambulatory Visit (INDEPENDENT_AMBULATORY_CARE_PROVIDER_SITE_OTHER): Payer: BLUE CROSS/BLUE SHIELD | Admitting: Obstetrics and Gynecology

## 2018-02-17 ENCOUNTER — Encounter: Payer: Self-pay | Admitting: Obstetrics and Gynecology

## 2018-02-17 VITALS — BP 123/55 | HR 83 | Wt 208.0 lb

## 2018-02-17 DIAGNOSIS — O0932 Supervision of pregnancy with insufficient antenatal care, second trimester: Secondary | ICD-10-CM

## 2018-02-17 DIAGNOSIS — Z3480 Encounter for supervision of other normal pregnancy, unspecified trimester: Secondary | ICD-10-CM

## 2018-02-17 DIAGNOSIS — Z98891 History of uterine scar from previous surgery: Secondary | ICD-10-CM

## 2018-02-17 NOTE — Progress Notes (Signed)
   PRENATAL VISIT NOTE  Subjective:  Wendy Aguilar is a 17 y.o. G2P1001 at 687w6d being seen today for ongoing prenatal care.  She is currently monitored for the following issues for this low-risk pregnancy and has Supervision of other normal pregnancy, antepartum; History of cesarean section; Encounter for fetal anatomic survey; and Late prenatal care affecting pregnancy in second trimester on their problem list.  Patient reports occasional contractions.  Contractions: Irritability. Vag. Bleeding: None.  Movement: Present. Denies leaking of fluid.   The following portions of the patient's history were reviewed and updated as appropriate: allergies, current medications, past family history, past medical history, past social history, past surgical history and problem list. Problem list updated.  Objective:   Vitals:   02/17/18 1026  BP: (!) 123/55  Pulse: 83  Weight: 208 lb (94.3 kg)    Fetal Status: Fetal Heart Rate (bpm): 140 Fundal Height: 36 cm Movement: Present     General:  Alert, oriented and cooperative. Patient is in no acute distress.  Skin: Skin is warm and dry. No rash noted.   Cardiovascular: Normal heart rate noted  Respiratory: Normal respiratory effort, no problems with respiration noted  Abdomen: Soft, gravid, appropriate for gestational age.  Pain/Pressure: Present     Pelvic: Cervical exam deferred      cephalic  Extremities: Normal range of motion.  Edema: None  Mental Status: Normal mood and affect. Normal behavior. Normal judgment and thought content.   Assessment and Plan:  Pregnancy: G2P1001 at 4287w6d  1. Supervision of other normal pregnancy, antepartum   2. Late prenatal care affecting pregnancy in second trimester   3. History of cesarean section For TOLAC  Preterm labor symptoms and general obstetric precautions including but not limited to vaginal bleeding, contractions, leaking of fluid and fetal movement were reviewed in detail with the  patient. Please refer to After Visit Summary for other counseling recommendations.  No follow-ups on file.  Future Appointments  Date Time Provider Department Center  02/22/2018  4:15 PM Judeth HornLawrence, Erin, [facility]  02/28/2018  1:15 PM Marny LowensteinWenzel, Julie N, PA-C [facility]    Conan BowensKelly M Davis, MD

## 2018-02-22 ENCOUNTER — Ambulatory Visit (INDEPENDENT_AMBULATORY_CARE_PROVIDER_SITE_OTHER): Payer: BLUE CROSS/BLUE SHIELD | Admitting: Student

## 2018-02-22 DIAGNOSIS — Z3480 Encounter for supervision of other normal pregnancy, unspecified trimester: Secondary | ICD-10-CM

## 2018-02-22 NOTE — Progress Notes (Signed)
   PRENATAL VISIT NOTE  Subjective:  Wendy Aguilar is a 17 y.o. G2P1001 at 6334w4d being seen today for ongoing prenatal care.  She is currently monitored for the following issues for this low-risk pregnancy and has Supervision of other normal pregnancy, antepartum; History of cesarean section; Encounter for fetal anatomic survey; and Late prenatal care affecting pregnancy in second trimester on their problem list.  Patient reports no complaints.  Contractions: Irritability. Vag. Bleeding: None.  Movement: Present. Denies leaking of fluid.   The following portions of the patient's history were reviewed and updated as appropriate: allergies, current medications, past family history, past medical history, past social history, past surgical history and problem list. Problem list updated.  Objective:   Vitals:   02/22/18 1634  BP: 125/72  Pulse: 75  Weight: 210 lb (95.3 kg)    Fetal Status: Fetal Heart Rate (bpm): 148 Fundal Height: 38 cm Movement: Present  Presentation: Vertex  General:  Alert, oriented and cooperative. Patient is in no acute distress.  Skin: Skin is warm and dry. No rash noted.   Cardiovascular: Normal heart rate noted  Respiratory: Normal respiratory effort, no problems with respiration noted  Abdomen: Soft, gravid, appropriate for gestational age.  Pain/Pressure: Present     Pelvic: Cervical exam performed Dilation: Fingertip Effacement (%): Thick Station: -3  Extremities: Normal range of motion.  Edema: None  Mental Status: Normal mood and affect. Normal behavior. Normal judgment and thought content.   Assessment and Plan:  Pregnancy: G2P1001 at 7934w4d  1. Supervision of other normal pregnancy, antepartum -reviewed GBS negative -Discussed upcoming appointments  Term labor symptoms and general obstetric precautions including but not limited to vaginal bleeding, contractions, leaking of fluid and fetal movement were reviewed in detail with the patient. Please  refer to After Visit Summary for other counseling recommendations.  Return in about 1 week (around 03/01/2018) for Routine OB.  Future Appointments  Date Time Provider Department Center  02/28/2018  1:15 PM Marny LowensteinWenzel, Julie N, [facility]    Judeth HornErin Aryahi Denzler, NP

## 2018-02-22 NOTE — Patient Instructions (Signed)
Vaginal Birth After Cesarean Delivery Vaginal birth after cesarean delivery (VBAC) is giving birth vaginally after previously delivering a baby by a cesarean. In the past, if a woman had a cesarean delivery, all births afterward would be done by cesarean delivery. This is no longer true. It can be safe for the mother to try a vaginal delivery after having a cesarean delivery. It is important to discuss VBAC with your health care provider early in the pregnancy so you can understand the risks, benefits, and options. It will give you time to decide what is best in your particular case. The final decision about whether to have a VBAC or repeat cesarean delivery should be between you and your health care provider. Any changes in your health or your baby's health during your pregnancy may make it necessary to change your initial decision about VBAC. Women who plan to have a VBAC should check with their health care provider to be sure that:  The previous cesarean delivery was done with a low transverse uterine cut (incision) (not a vertical classical incision).  The birth canal is big enough for the baby.  There were no other operations on the uterus.  An electronic fetal monitor (EFM) will be on at all times during labor.  An operating room will be available and ready in case an emergency cesarean delivery is needed.  A health care provider and surgical nursing staff will be available at all times during labor to be ready to do an emergency delivery cesarean if necessary.  An anesthesiologist will be present in case an emergency cesarean delivery is needed.  The nursery is prepared and has adequate personnel and necessary equipment available to care for the baby in case of an emergency cesarean delivery. Benefits of VBAC  Shorter stay in the hospital.  Avoidance of risks associated with cesarean delivery, such as: ? Surgical complications, such as opening of the incision or hernia in the  incision. ? Injury to other organs. ? Fever. This can occur if an infection develops after surgery. It can also occur as a reaction to the medicine given to make you numb during the surgery.  Less blood loss and need for blood transfusions.  Lower risk of blood clots and infection.  Shorter recovery.  Decreased risk for having to remove the uterus (hysterectomy).  Decreased risk for the placenta to completely or partially cover the opening of the uterus (placenta previa) with a future pregnancy.  Decrease risk in future labor and delivery. Risks of a VBAC  Tearing (rupture) of the uterus. This is occurs in less than 1% of VBACs. The risk of this happening is higher if: ? Steps are taken to begin the labor process (induce labor) or stimulate or strengthen contractions (augment labor). ? Medicine is used to soften (ripen) the cervix.  Having to remove the uterus (hysterectomy) if it ruptures. VBAC should not be done if:  The previous cesarean delivery was done with a vertical (classical) or T-shaped incision or you do not know what kind of incision was made.  You had a ruptured uterus.  You have had certain types of surgery on your uterus, such as removal of uterine fibroids. Ask your health care provider about other types of surgeries that prevent you from having a VBAC.  You have certain medical or childbirth (obstetrical) problems.  There are problems with the baby.  You have had two previous cesarean deliveries and no vaginal deliveries. Other facts to know about VBAC:  It   is safe to have an epidural anesthetic with VBAC.  It is safe to turn the baby from a breech position (attempt an external cephalic version).  It is safe to try a VBAC with twins.  VBAC may not be successful if your baby weights 8.8 lb (4 kg) or more. However, weight predictions are not always accurate and should not be used alone to decide if VBAC is right for you.  There is an increased failure rate  if the time between the cesarean delivery and VBAC is less than 19 months.  Your health care provider may advise against a VBAC if you have preeclampsia (high blood pressure, protein in the urine, and swelling of face and extremities).  VBAC is often successful if you previously gave birth vaginally.  VBAC is often successful when the labor starts spontaneously before the due date.  Delivering a baby through a VBAC is similar to having a normal spontaneous vaginal delivery. This information is not intended to replace advice given to you by your health care provider. Make sure you discuss any questions you have with your health care provider. Document Released: 01/09/2007 Document Revised: 12/25/2015 Document Reviewed: 02/15/2013 Elsevier Interactive Patient Education  2018 Elsevier Inc.  

## 2018-02-28 ENCOUNTER — Ambulatory Visit (INDEPENDENT_AMBULATORY_CARE_PROVIDER_SITE_OTHER): Payer: BLUE CROSS/BLUE SHIELD | Admitting: Medical

## 2018-02-28 ENCOUNTER — Encounter: Payer: Self-pay | Admitting: Medical

## 2018-02-28 VITALS — BP 123/70 | HR 95 | Wt 212.9 lb

## 2018-02-28 DIAGNOSIS — Z3480 Encounter for supervision of other normal pregnancy, unspecified trimester: Secondary | ICD-10-CM

## 2018-02-28 DIAGNOSIS — Z3483 Encounter for supervision of other normal pregnancy, third trimester: Secondary | ICD-10-CM

## 2018-02-28 DIAGNOSIS — Z98891 History of uterine scar from previous surgery: Secondary | ICD-10-CM

## 2018-02-28 NOTE — Progress Notes (Signed)
   PRENATAL VISIT NOTE  Subjective:  Wendy Aguilar is a 17 y.o. G2P1001 at 2814w3d being seen today for ongoing prenatal care.  She is currently monitored for the following issues for this low-risk pregnancy and has Supervision of other normal pregnancy, antepartum; History of cesarean section; Encounter for fetal anatomic survey; and Late prenatal care affecting pregnancy in second trimester on their problem list.  Patient reports no complaints.  Contractions: Irritability. Vag. Bleeding: None.  Movement: Present. Denies leaking of fluid.   The following portions of the patient's history were reviewed and updated as appropriate: allergies, current medications, past family history, past medical history, past social history, past surgical history and problem list. Problem list updated.  Objective:   Vitals:   02/28/18 1326  BP: 123/70  Pulse: 95  Weight: 212 lb 14.4 oz (96.6 kg)    Fetal Status: Fetal Heart Rate (bpm): 132 Fundal Height: 39 cm Movement: Present     General:  Alert, oriented and cooperative. Patient is in no acute distress.  Skin: Skin is warm and dry. No rash noted.   Cardiovascular: Normal heart rate noted  Respiratory: Normal respiratory effort, no problems with respiration noted  Abdomen: Soft, gravid, appropriate for gestational age.  Pain/Pressure: Present     Pelvic: Cervical exam deferred        Extremities: Normal range of motion.  Edema: None  Mental Status: Normal mood and affect. Normal behavior. Normal judgment and thought content.   Assessment and Plan:  Pregnancy: G2P1001 at 7414w3d  1. Supervision of other normal pregnancy, antepartum - doing well, no complaints - discussed scheduling IOL at next visit if needed  - discussed negative GBS from last visit   2. History of cesarean section - plan for VBAC, consent on chart  Term labor symptoms and general obstetric precautions including but not limited to vaginal bleeding, contractions, leaking of  fluid and fetal movement were reviewed in detail with the patient. Please refer to After Visit Summary for other counseling recommendations.  Return in about 1 week (around 03/07/2018) for LOB.  Vonzella NippleJulie Paisli Silfies, PA-C

## 2018-02-28 NOTE — Patient Instructions (Signed)
Research childbirth classes and hospital preregistration at [email or web address]  Fetal Movement Counts Patient Name: ________________________________________________ Patient Due Date: ____________________ What is a fetal movement count? A fetal movement count is the number of times that you feel your baby move during a certain amount of time. This may also be called a fetal kick count. A fetal movement count is recommended for every pregnant woman. You may be asked to start counting fetal movements as early as week 28 of your pregnancy. Pay attention to when your baby is most active. You may notice your baby's sleep and wake cycles. You may also notice things that make your baby move more. You should do a fetal movement count:  When your baby is normally most active.  At the same time each day.  A good time to count movements is while you are resting, after having something to eat and drink. How do I count fetal movements? 1. Find a quiet, comfortable area. Sit, or lie down on your side. 2. Write down the date, the start time and stop time, and the number of movements that you felt between those two times. Take this information with you to your health care visits. 3. For 2 hours, count kicks, flutters, swishes, rolls, and jabs. You should feel at least 10 movements during 2 hours. 4. You may stop counting after you have felt 10 movements. 5. If you do not feel 10 movements in 2 hours, have something to eat and drink. Then, keep resting and counting for 1 hour. If you feel at least 4 movements during that hour, you may stop counting. Contact a health care provider if:  You feel fewer than 4 movements in 2 hours.  Your baby is not moving like he or she usually does. Date: ____________ Start time: ____________ Stop time: ____________ Movements: ____________ Date: ____________ Start time: ____________ Stop time: ____________ Movements: ____________ Date: ____________ Start time: ____________  Stop time: ____________ Movements: ____________ Date: ____________ Start time: ____________ Stop time: ____________ Movements: ____________ Date: ____________ Start time: ____________ Stop time: ____________ Movements: ____________ Date: ____________ Start time: ____________ Stop time: ____________ Movements: ____________ Date: ____________ Start time: ____________ Stop time: ____________ Movements: ____________ Date: ____________ Start time: ____________ Stop time: ____________ Movements: ____________ Date: ____________ Start time: ____________ Stop time: ____________ Movements: ____________ This information is not intended to replace advice given to you by your health care provider. Make sure you discuss any questions you have with your health care provider. Document Released: 08/18/2006 Document Revised: 03/17/2016 Document Reviewed: 08/28/2015 Elsevier Interactive Patient Education  2018 [employer]  Braxton Hicks Contractions Contractions of the uterus can occur throughout pregnancy, but they are not always a sign that you are in labor. You may have practice contractions called Braxton Hicks contractions. These false labor contractions are sometimes confused with true labor. What are Braxton Hicks contractions? Braxton Hicks contractions are tightening movements that occur in the muscles of the uterus before labor. Unlike true labor contractions, these contractions do not result in opening (dilation) and thinning of the cervix. Toward the end of pregnancy (32-34 weeks), Braxton Hicks contractions can happen more often and may become stronger. These contractions are sometimes difficult to tell apart from true labor because they can be very uncomfortable. You should not feel embarrassed if you go to the hospital with false labor. Sometimes, the only way to tell if you are in true labor is for your health care provider to look for changes in the cervix. The health care provider will   do a physical  exam and may monitor your contractions. If you are not in true labor, the exam should show that your cervix is not dilating and your water has not broken. If there are other health problems associated with your pregnancy, it is completely safe for you to be sent home with false labor. You may continue to have Braxton Hicks contractions until you go into true labor. How to tell the difference between true labor and false labor True labor  Contractions last 30-70 seconds.  Contractions become very regular.  Discomfort is usually felt in the top of the uterus, and it spreads to the lower abdomen and low back.  Contractions do not go away with walking.  Contractions usually become more intense and increase in frequency.  The cervix dilates and gets thinner. False labor  Contractions are usually shorter and not as strong as true labor contractions.  Contractions are usually irregular.  Contractions are often felt in the front of the lower abdomen and in the groin.  Contractions may go away when you walk around or change positions while lying down.  Contractions get weaker and are shorter-lasting as time goes on.  The cervix usually does not dilate or become thin. Follow these instructions at home:  Take over-the-counter and prescription medicines only as told by your health care provider.  Keep up with your usual exercises and follow other instructions from your health care provider.  Eat and drink lightly if you think you are going into labor.  If Braxton Hicks contractions are making you uncomfortable: ? Change your position from lying down or resting to walking, or change from walking to resting. ? Sit and rest in a tub of warm water. ? Drink enough fluid to keep your urine pale yellow. Dehydration may cause these contractions. ? Do slow and deep breathing several times an hour.  Keep all follow-up prenatal visits as told by your health care provider. This is  important. Contact a health care provider if:  You have a fever.  You have continuous pain in your abdomen. Get help right away if:  Your contractions become stronger, more regular, and closer together.  You have fluid leaking or gushing from your vagina.  You pass blood-tinged mucus (bloody show).  You have bleeding from your vagina.  You have low back pain that you never had before.  You feel your baby's head pushing down and causing pelvic pressure.  Your baby is not moving inside you as much as it used to. Summary  Contractions that occur before labor are called Braxton Hicks contractions, false labor, or practice contractions.  Braxton Hicks contractions are usually shorter, weaker, farther apart, and less regular than true labor contractions. True labor contractions usually become progressively stronger and regular and they become more frequent.  Manage discomfort from Braxton Hicks contractions by changing position, resting in a warm bath, drinking plenty of water, or practicing deep breathing. This information is not intended to replace advice given to you by your health care provider. Make sure you discuss any questions you have with your health care provider. Document Released: 12/02/2016 Document Revised: 12/02/2016 Document Reviewed: 12/02/2016 Elsevier Interactive Patient Education  2018 [employer] Inc.    

## 2018-02-28 NOTE — Progress Notes (Signed)
   PRENATAL VISIT NOTE  Subjective:  Wendy Aguilar is a 17 y.o. G2P1001 at 2449w3d being seen today for ongoing prenatal care.  She is currently monitored for the following issues for this low-risk pregnancy and has Supervision of other normal pregnancy, antepartum; History of cesarean section; Encounter for fetal anatomic survey; and Late prenatal care affecting pregnancy in second trimester on their problem list.  Patient reports no complaints. She states she wishes to continue with plan for vaginal delivery trial.  Contractions: Irritability. Vag. Bleeding: None.  Movement: Present. Denies leaking of fluid.   The following portions of the patient's history were reviewed and updated as appropriate: allergies, current medications, past family history, past medical history, past social history, past surgical history and problem list. Problem list updated.  Objective:   Vitals:   02/28/18 1326  BP: 123/70  Pulse: 95  Weight: 212 lb 14.4 oz (96.6 kg)    Fetal Status: Fetal Heart Rate (bpm): 132 Fundal Height: 39 cm Movement: Present     General:  Alert, oriented and cooperative. Patient is in no acute distress.  Skin: Skin is warm and dry. No rash noted.   Cardiovascular: Normal heart rate noted  Respiratory: Normal respiratory effort, no problems with respiration noted  Abdomen: Soft, gravid, appropriate for gestational age.  Pain/Pressure: Present     Pelvic: Cervical exam deferred        Extremities: Normal range of motion.  Edema: None  Mental Status: Normal mood and affect. Normal behavior. Normal judgment and thought content.   Assessment and Plan:  Pregnancy: G2P1001 at 3749w3d  1. Supervision of other normal pregnancy, antepartum  - Patient doing well with no complaints  - [facility] and [facility]  - Instructed to return upon onset of bleeding, abdominal pain, vaginal discharge, or other symptoms  - Discussed plan to schedule  induction for 41w if not progressing.  2. History of cesarean section  - Patient wishes to continue with plan for vaginal delivery trial. Reviewed possible complications.  Preterm labor symptoms and general obstetric precautions including but not limited to vaginal bleeding, contractions, leaking of fluid and fetal movement were reviewed in detail with the patient. Please refer to After Visit Summary for other counseling recommendations.  Return in about 1 week (around 03/07/2018) for LOB.  Future Appointments  Date Time Provider Department Center  03/09/2018  9:35 AM Crisoforo OxfordKooistra, Charlesetta GaribaldiKathryn Lorraine, CNM [facility]    Margarita RanaHarrison D Elieser Tetrick, BrockwayStudent-PA

## 2018-03-09 ENCOUNTER — Telehealth (HOSPITAL_COMMUNITY): Payer: Self-pay | Admitting: *Deleted

## 2018-03-09 ENCOUNTER — Encounter (HOSPITAL_COMMUNITY): Payer: Self-pay | Admitting: *Deleted

## 2018-03-09 ENCOUNTER — Ambulatory Visit (INDEPENDENT_AMBULATORY_CARE_PROVIDER_SITE_OTHER): Payer: BLUE CROSS/BLUE SHIELD | Admitting: Student

## 2018-03-09 VITALS — BP 118/54 | HR 58 | Wt 213.0 lb

## 2018-03-09 DIAGNOSIS — Z3480 Encounter for supervision of other normal pregnancy, unspecified trimester: Secondary | ICD-10-CM

## 2018-03-09 NOTE — Telephone Encounter (Signed)
Preadmission screen  

## 2018-03-09 NOTE — Progress Notes (Signed)
   PRENATAL VISIT NOTE  Subjective:  Wendy Aguilar is a 17 y.o. G2P1001 at 4456w5d being seen today for ongoing prenatal care.  She is currently monitored for the following issues for this low-risk pregnancy and has Supervision of other normal pregnancy, antepartum; History of cesarean section; Encounter for fetal anatomic survey; and Late prenatal care affecting pregnancy in second trimester on their problem list.  Patient reports no complaints.  Contractions: Irritability. Vag. Bleeding: None.  Movement: Present. Denies leaking of fluid.   The following portions of the patient's history were reviewed and updated as appropriate: allergies, current medications, past family history, past medical history, past social history, past surgical history and problem list. Problem list updated.  Objective:   Vitals:   03/09/18 0942  BP: (!) 118/54  Pulse: (!) 58  Weight: 213 lb (96.6 kg)    Fetal Status: Fetal Heart Rate (bpm): 142 Fundal Height: 40 cm Movement: Present  Presentation: Vertex  General:  Alert, oriented and cooperative. Patient is in no acute distress.  Skin: Skin is warm and dry. No rash noted.   Cardiovascular: Normal heart rate noted  Respiratory: Normal respiratory effort, no problems with respiration noted  Abdomen: Soft, gravid, appropriate for gestational age.  Pain/Pressure: Present     Pelvic: Cervical exam deferred Dilation: Fingertip Effacement (%): Thick    Extremities: Normal range of motion.  Edema: None  Mental Status: Normal mood and affect. Normal behavior. Normal judgment and thought content.   Assessment and Plan:  Pregnancy: G2P1001 at 7356w5d  1. Supervision of other normal pregnancy, antepartum    -Doing well; cervix is essentially closed. WIll order NSt for next week; appt with provider not needed and induction for 41 weeks with low dose pitocin.  -Induction orders placed.   Term labor symptoms and general obstetric precautions including but not  limited to vaginal bleeding, contractions, leaking of fluid and fetal movement were reviewed in detail with the patient. Please refer to After Visit Summary for other counseling recommendations.  Return in about 1 week (around 03/16/2018), or NST only.  No future appointments.  Marylene LandKathryn Lorraine Indie Boehne, CNM

## 2018-03-09 NOTE — Progress Notes (Signed)
IOL scheduled for August 18th @ midnight.  Pt notified.

## 2018-03-09 NOTE — Patient Instructions (Signed)
Labor Induction Labor induction is when steps are taken to cause a pregnant woman to begin the labor process. Most women go into labor on their own between 37 weeks and 42 weeks of the pregnancy. When this does not happen or when there is a medical need, methods may be used to induce labor. Labor induction causes a pregnant woman's uterus to contract. It also causes the cervix to soften (ripen), open (dilate), and thin out (efface). Usually, labor is not induced before 39 weeks of the pregnancy unless there is a problem with the baby or mother. Before inducing labor, your health care provider will consider a number of factors, including the following:  The medical condition of you and the baby.  How many weeks along you are.  The status of the baby's lung maturity.  The condition of the cervix.  The position of the baby. What are the reasons for labor induction? Labor may be induced for the following reasons:  The health of the baby or mother is at risk.  The pregnancy is overdue by 1 week or more.  The water breaks but labor does not start on its own.  The mother has a health condition or serious illness, such as high blood pressure, infection, placental abruption, or diabetes.  The amniotic fluid amounts are low around the baby.  The baby is distressed. Convenience or wanting the baby to be born on a certain date is not a reason for inducing labor. What methods are used for labor induction? Several methods of labor induction may be used, such as:  Prostaglandin medicine. This medicine causes the cervix to dilate and ripen. The medicine will also start contractions. It can be taken by mouth or by inserting a suppository into the vagina.  Inserting a thin tube (catheter) with a balloon on the end into the vagina to dilate the cervix. Once inserted, the balloon is expanded with water, which causes the cervix to open.  Stripping the membranes. Your health care provider separates  amniotic sac tissue from the cervix, causing the cervix to be stretched and causing the release of a hormone called progesterone. This may cause the uterus to contract. It is often done during an office visit. You will be sent home to wait for the contractions to begin. You will then come in for an induction.  Breaking the water. Your health care provider makes a hole in the amniotic sac using a small instrument. Once the amniotic sac breaks, contractions should begin. This may still take hours to see an effect.  Medicine to trigger or strengthen contractions. This medicine is given through an IV access tube inserted into a vein in your arm. All of the methods of induction, besides stripping the membranes, will be done in the hospital. Induction is done in the hospital so that you and the baby can be carefully monitored. How long does it take for labor to be induced? Some inductions can take up to 2-3 days. Depending on the cervix, it usually takes less time. It takes longer when you are induced early in the pregnancy or if this is your first pregnancy. If a mother is still pregnant and the induction has been going on for 2-3 days, either the mother will be sent home or a cesarean delivery will be needed. What are the risks associated with labor induction? Some of the risks of induction include:  Changes in fetal heart rate, such as too high, too low, or erratic.  Fetal distress.    Chance of infection for the mother and baby.  Increased chance of having a cesarean delivery.  Breaking off (abruption) of the placenta from the uterus (rare).  Uterine rupture (very rare). When induction is needed for medical reasons, the benefits of induction may outweigh the risks. What are some reasons for not inducing labor? Labor induction should not be done if:  It is shown that your baby does not tolerate labor.  You have had previous surgeries on your uterus, such as a myomectomy or the removal of  fibroids.  Your placenta lies very low in the uterus and blocks the opening of the cervix (placenta previa).  Your baby is not in a head-down position.  The umbilical cord drops down into the birth canal in front of the baby. This could cut off the baby's blood and oxygen supply.  You have had a previous cesarean delivery.  There are unusual circumstances, such as the baby being extremely premature. This information is not intended to replace advice given to you by your health care provider. Make sure you discuss any questions you have with your health care provider. Document Released: 12/08/2006 Document Revised: 12/25/2015 Document Reviewed: 02/15/2013 Elsevier Interactive Patient Education  2017 Elsevier Inc.  

## 2018-03-11 ENCOUNTER — Encounter (HOSPITAL_COMMUNITY): Payer: Self-pay | Admitting: *Deleted

## 2018-03-11 ENCOUNTER — Inpatient Hospital Stay (HOSPITAL_COMMUNITY)
Admission: AD | Admit: 2018-03-11 | Discharge: 2018-03-12 | DRG: 833 | Disposition: A | Discharge status: Home / Self Care | Payer: BLUE CROSS/BLUE SHIELD | Attending: Obstetrics and Gynecology | Admitting: Obstetrics and Gynecology

## 2018-03-11 DIAGNOSIS — O479 False labor, unspecified: Secondary | ICD-10-CM | POA: Insufficient documentation

## 2018-03-11 DIAGNOSIS — Z3480 Encounter for supervision of other normal pregnancy, unspecified trimester: Secondary | ICD-10-CM

## 2018-03-11 DIAGNOSIS — Z3689 Encounter for other specified antenatal screening: Secondary | ICD-10-CM

## 2018-03-11 DIAGNOSIS — Z98891 History of uterine scar from previous surgery: Secondary | ICD-10-CM

## 2018-03-11 DIAGNOSIS — Z3A40 40 weeks gestation of pregnancy: Secondary | ICD-10-CM

## 2018-03-11 DIAGNOSIS — O471 False labor at or after 37 completed weeks of gestation: Principal | ICD-10-CM | POA: Diagnosis present

## 2018-03-11 NOTE — Progress Notes (Signed)
Sharen CounterLisa Leftwich-Kirby CNM notified of pt's admission and status. Aware of sve, ctx pattern, reactive FHR. Pt may stay and walk and hour for reck or go home. Pt's choice.

## 2018-03-11 NOTE — Progress Notes (Signed)
Sharen CounterLisa Leftwich-Kirby CNM made aware of FHR tracing and reviewed. Will cont to monitor about 30mins

## 2018-03-11 NOTE — MAU Note (Signed)
Having a lot of pelvic pressure and some lower back pain today. Denies LOF or bleeding

## 2018-03-12 ENCOUNTER — Encounter (HOSPITAL_COMMUNITY): Payer: Self-pay

## 2018-03-12 DIAGNOSIS — Z3689 Encounter for other specified antenatal screening: Secondary | ICD-10-CM

## 2018-03-12 DIAGNOSIS — O479 False labor, unspecified: Secondary | ICD-10-CM | POA: Insufficient documentation

## 2018-03-12 DIAGNOSIS — O471 False labor at or after 37 completed weeks of gestation: Secondary | ICD-10-CM | POA: Diagnosis present

## 2018-03-12 DIAGNOSIS — Z3A40 40 weeks gestation of pregnancy: Secondary | ICD-10-CM | POA: Diagnosis not present

## 2018-03-12 LAB — CBC
HEMATOCRIT: 40.5 % (ref 36.0–46.0)
Hemoglobin: 13.7 g/dL (ref 12.0–15.0)
MCH: 28.6 pg (ref 26.0–34.0)
MCHC: 33.8 g/dL (ref 30.0–36.0)
MCV: 84.6 fL (ref 78.0–100.0)
Platelets: 175 10*3/uL (ref 150–400)
RBC: 4.79 MIL/uL (ref 3.87–5.11)
RDW: 13.5 % (ref 11.5–15.5)
WBC: 11.5 10*3/uL — AB (ref 4.0–10.5)

## 2018-03-12 LAB — TYPE AND SCREEN
ABO/RH(D): A POS
Antibody Screen: NEGATIVE

## 2018-03-12 MED ORDER — SOD CITRATE-CITRIC ACID 500-334 MG/5ML PO SOLN: 30.0000 mL | ORAL | Status: DC | PRN

## 2018-03-12 MED ORDER — OXYTOCIN 40 UNITS IN LACTATED RINGERS INFUSION - SIMPLE MED: 2.5000 [IU]/h | INTRAVENOUS | Status: DC

## 2018-03-12 MED ORDER — OXYTOCIN BOLUS FROM INFUSION: 500.0000 mL | Freq: Once | INTRAVENOUS | Status: DC

## 2018-03-12 MED ORDER — LACTATED RINGERS IV SOLN
INTRAVENOUS | Status: DC
  Administered 2018-03-12 (×2): via INTRAVENOUS

## 2018-03-12 MED ORDER — OXYCODONE-ACETAMINOPHEN 5-325 MG PO TABS: 2.0000 | ORAL_TABLET | ORAL | Status: DC | PRN

## 2018-03-12 MED ORDER — LACTATED RINGERS IV SOLN: 500.0000 mL | INTRAVENOUS | Status: DC | PRN

## 2018-03-12 MED ORDER — ONDANSETRON HCL 4 MG/2ML IJ SOLN: 4.0000 mg | Freq: Four times a day (QID) | INTRAMUSCULAR | Status: DC | PRN

## 2018-03-12 MED ORDER — ACETAMINOPHEN 325 MG PO TABS: 650.0000 mg | ORAL_TABLET | ORAL | Status: DC | PRN

## 2018-03-12 MED ORDER — OXYCODONE-ACETAMINOPHEN 5-325 MG PO TABS: 1.0000 | ORAL_TABLET | ORAL | Status: DC | PRN

## 2018-03-12 MED ORDER — LIDOCAINE HCL (PF) 1 % IJ SOLN: 30.0000 mL | INTRAMUSCULAR | Status: DC | PRN

## 2018-03-12 NOTE — Progress Notes (Signed)
Baby continues to be active

## 2018-03-12 NOTE — Anesthesia Pain Management Evaluation Note (Signed)
  CRNA Pain Management Visit Note  Patient: Wendy Aguilar, 17 y.o., female  "Hello [facility] am a [facility] of the [facility]. We have an anesthesia team available at all times to provide care throughout the hospital, including epidural management and anesthesia for C-section. I don't know your plan for the delivery whether it a natural birth, water birth, IV sedation, nitrous supplementation, doula or epidural, but we want to meet your pain goals."   1.Was your pain managed to your expectations on prior hospitalizations?   Yes   2.What is your expectation for pain management during this hospitalization?     Epidural  3.How can we help you reach that goal? unsure  Record the patient's initial score and the patient's pain goal.   Pain: 0  Pain Goal: 5 [facility] wants you to be able to say your pain was always managed very well.  Cephus ShellingBURGER,[name] 03/12/2018

## 2018-03-12 NOTE — Progress Notes (Signed)
To 168 via w/c

## 2018-03-12 NOTE — Progress Notes (Addendum)
Patient cervical check performed. Noted t be 0/thick/-3, presenting part not readily noted. US confirmed cephalic presentation but fetal head high in pelvis still. Fetal movement of extremities noted during ultrasound. Dr Emelda FearFerguson in room with me.   Will review records to see if continued expectant mgmt of vaginal delivery still appropriate.   Wendy SchatzPatricia Yamna Mackel, DO Family Medicine, PGY-3

## 2018-03-12 NOTE — Discharge Instructions (Signed)
Braxton Hicks Contractions °Contractions of the uterus can occur throughout pregnancy, but they are not always a sign that you are in labor. You may have practice contractions called Braxton Hicks contractions. These false labor contractions are sometimes confused with true labor. °What are Braxton Hicks contractions? °Braxton Hicks contractions are tightening movements that occur in the muscles of the uterus before labor. Unlike true labor contractions, these contractions do not result in opening (dilation) and thinning of the cervix. Toward the end of pregnancy (32-34 weeks), Braxton Hicks contractions can happen more often and may become stronger. These contractions are sometimes difficult to tell apart from true labor because they can be very uncomfortable. You should not feel embarrassed if you go to the hospital with false labor. °Sometimes, the only way to tell if you are in true labor is for your health care provider to look for changes in the cervix. The health care provider will do a physical exam and may monitor your contractions. If you are not in true labor, the exam should show that your cervix is not dilating and your water has not broken. °If there are other health problems associated with your pregnancy, it is completely safe for you to be sent home with false labor. You may continue to have Braxton Hicks contractions until you go into true labor. °How to tell the difference between true labor and false labor °True labor °· Contractions last 30-70 seconds. °· Contractions become very regular. °· Discomfort is usually felt in the top of the uterus, and it spreads to the lower abdomen and low back. °· Contractions do not go away with walking. °· Contractions usually become more intense and increase in frequency. °· The cervix dilates and gets thinner. °False labor °· Contractions are usually shorter and not as strong as true labor contractions. °· Contractions are usually irregular. °· Contractions  are often felt in the front of the lower abdomen and in the groin. °· Contractions may go away when you walk around or change positions while lying down. °· Contractions get weaker and are shorter-lasting as time goes on. °· The cervix usually does not dilate or become thin. °Follow these instructions at home: °· Take over-the-counter and prescription medicines only as told by your health care provider. °· Keep up with your usual exercises and follow other instructions from your health care provider. °· Eat and drink lightly if you think you are going into labor. °· If Braxton Hicks contractions are making you uncomfortable: °? Change your position from lying down or resting to walking, or change from walking to resting. °? Sit and rest in a tub of warm water. °? Drink enough fluid to keep your urine pale yellow. Dehydration may cause these contractions. °? Do slow and deep breathing several times an hour. °· Keep all follow-up prenatal visits as told by your health care provider. This is important. °Contact a health care provider if: °· You have a fever. °· You have continuous pain in your abdomen. °Get help right away if: °· Your contractions become stronger, more regular, and closer together. °· You have fluid leaking or gushing from your vagina. °· You pass blood-tinged mucus (bloody show). °· You have bleeding from your vagina. °· You have low back pain that you never had before. °· You feel your baby’s head pushing down and causing pelvic pressure. °· Your baby is not moving inside you as much as it used to. °Summary °· Contractions that occur before labor are called Braxton   Hicks contractions, false labor, or practice contractions. °· Braxton Hicks contractions are usually shorter, weaker, farther apart, and less regular than true labor contractions. True labor contractions usually become progressively stronger and regular and they become more frequent. °· Manage discomfort from Braxton Hicks contractions by  changing position, resting in a warm bath, drinking plenty of water, or practicing deep breathing. °This information is not intended to replace advice given to you by your health care provider. Make sure you discuss any questions you have with your health care provider. °Document Released: 12/02/2016 Document Revised: 12/02/2016 Document Reviewed: 12/02/2016 °Elsevier Interactive Patient Education © 2018 [employer] Inc. ° °Fetal Movement Counts °Patient Name: ________________________________________________ Patient Due Date: ____________________ °What is a fetal movement count? °A fetal movement count is the number of times that you feel your baby move during a certain amount of time. This may also be called a fetal kick count. A fetal movement count is recommended for every pregnant woman. You may be asked to start counting fetal movements as early as week 28 of your pregnancy. °Pay attention to when your baby is most active. You may notice your baby's sleep and wake cycles. You may also notice things that make your baby move more. You should do a fetal movement count: °· When your baby is normally most active. °· At the same time each day. ° °A good time to count movements is while you are resting, after having something to eat and drink. °How do I count fetal movements? °1. Find a quiet, comfortable area. Sit, or lie down on your side. °2. Write down the date, the start time and stop time, and the number of movements that you felt between those two times. Take this information with you to your health care visits. °3. For 2 hours, count kicks, flutters, swishes, rolls, and jabs. You should feel at least 10 movements during 2 hours. °4. You may stop counting after you have felt 10 movements. °5. If you do not feel 10 movements in 2 hours, have something to eat and drink. Then, keep resting and counting for 1 hour. If you feel at least 4 movements during that hour, you may stop counting. °Contact a health care  provider if: °· You feel fewer than 4 movements in 2 hours. °· Your baby is not moving like he or she usually does. °Date: ____________ Start time: ____________ Stop time: ____________ Movements: ____________ °Date: ____________ Start time: ____________ Stop time: ____________ Movements: ____________ °Date: ____________ Start time: ____________ Stop time: ____________ Movements: ____________ °Date: ____________ Start time: ____________ Stop time: ____________ Movements: ____________ °Date: ____________ Start time: ____________ Stop time: ____________ Movements: ____________ °Date: ____________ Start time: ____________ Stop time: ____________ Movements: ____________ °Date: ____________ Start time: ____________ Stop time: ____________ Movements: ____________ °Date: ____________ Start time: ____________ Stop time: ____________ Movements: ____________ °Date: ____________ Start time: ____________ Stop time: ____________ Movements: ____________ °This information is not intended to replace advice given to you by your health care provider. Make sure you discuss any questions you have with your health care provider. °Document Released: 08/18/2006 Document Revised: 03/17/2016 Document Reviewed: 08/28/2015 °Elsevier Interactive Patient Education © 2018 Else[employer] Inc. ° °

## 2018-03-12 NOTE — Progress Notes (Signed)
L Leftwich-Kirby CNM notified pt wants to go home. EFM tracing reviewed and CNM will come talk with pt

## 2018-03-12 NOTE — H&P (Signed)
Wendy Aguilar is a 17 y.o. female G2P1001 @[redacted]w[redacted]d  pt of [facility] with hx C/S for FHR decels presenting for labor evaluation.  Cervix is unchanged but FHR baseline difficult to evaluate, long accels with normal baseline vs baseline change with variables.  Pt feeling contractions, able to walk/talk through them.  Desires VBAC this pregnancy. She reports good fetal movement, denies LOF, vaginal bleeding, vaginal itching/burning, urinary symptoms, h/a, dizziness, n/v, or fever/chills.     OB History    Gravida  2   Para  1   Term  1   Preterm      AB      Living  1     SAB      TAB      Ectopic      Multiple  0   Live Births  1          Past Medical History:  Diagnosis Date  . Medical history non-contributory    Past Surgical History:  Procedure Laterality Date  . CESAREAN SECTION N/A 10/10/2016   Procedure: CESAREAN SECTION;  Surgeon: Adam PhenixJames G Arnold, MD;  [facility];  Service: Obstetrics;  Laterality: N/A;   Family History: family history is not on file. Social History:  reports that she has never smoked. She has never used smokeless tobacco. She reports that she does not drink alcohol or use drugs.     Maternal Diabetes: No Genetic Screening: Normal Maternal Ultrasounds/Referrals: Normal Fetal Ultrasounds or other Referrals:  None Maternal Substance Abuse:  No Significant Maternal Medications:  None Significant Maternal Lab Results:  Lab values include: Group B Strep negative Other Comments:  None  Review of Systems  Constitutional: Negative for chills, fever and malaise/fatigue.  Eyes: Negative for blurred vision.  Respiratory: Negative for cough and shortness of breath.   Cardiovascular: Negative for chest pain.  Gastrointestinal: Positive for abdominal pain. Negative for heartburn and vomiting.  Genitourinary: Negative for dysuria, frequency and urgency.  Musculoskeletal: Negative.   Neurological: Negative for dizziness and headaches.   Psychiatric/Behavioral: Negative for depression.   Maternal Medical History:  Contractions: Onset was 3-5 hours ago.   Frequency: regular.   Duration is approximately 1 minute.   Perceived severity is moderate.    Fetal activity: Perceived fetal activity is normal.   Last perceived fetal movement was within the past hour.    Prenatal complications: no prenatal complications Prenatal Complications - Diabetes: none.    Dilation: 1 Effacement (%): Thick Station: -3 Exam by:: Quintella BatonJo Barham RNC Blood pressure (!) 136/59, pulse 76, temperature 98.7 F (37.1 C), resp. rate 18, height 5\' 9"  (1.753 m), weight 98 kg, last menstrual period 06/04/2017, unknown if currently breastfeeding. Maternal Exam:  Uterine Assessment: Contraction strength is mild.  Contraction frequency is regular.   Abdomen: Surgical scars: low transverse.   Fetal presentation: vertex     Fetal Exam Fetal Monitor Review: Mode: ultrasound.   Baseline rate: 135.  Variability: moderate (6-25 bpm).   Pattern: accelerations present and variable decelerations.    Fetal State Assessment: Category II - tracings are indeterminate.     Physical Exam  Nursing note and vitals reviewed. Constitutional: She is oriented to person, place, and time. She appears well-developed and well-nourished.  Neck: Normal range of motion.  Cardiovascular: Normal rate, regular rhythm and normal heart sounds.  Respiratory: Effort normal and breath sounds normal.  GI: Soft.  Musculoskeletal: Normal range of motion.  Neurological: She is alert and oriented to person, place,  and time.  Skin: Skin is warm and dry.  Psychiatric: She has a normal mood and affect. Her behavior is normal. Judgment and thought content normal.    Prenatal labs: ABO, Rh: A/Positive/-- (03/22 1616) Antibody: Negative (03/22 1616) Rubella: 2.74 (03/22 1616) RPR: Non Reactive (05/16 0829)  HBsAg: Negative (03/22 1616)  HIV: Non Reactive (05/16 0829)  GBS:    negative  Assessment/Plan: G2P1001 @[redacted]w[redacted]d  by LMP GBS neg  Admit to BS Early labor with ? nonreactive NST Augment if needed Anticipate VBAC  Sharen Counter 03/12/2018, 1:51 AM

## 2018-03-12 NOTE — Discharge Summary (Addendum)
    OB Discharge Summary     Patient Name: Wendy Aguilar DOB: 12/30/2000 MRN: [id number]  Date of admission: 03/11/2018 Discharging MD: Christin BachJohn Ferguson, MD Date of discharge: 03/12/2018  Admitting diagnosis: 40 WKS, CTXS, BACK PAIN Intrauterine pregnancy: 5872w1d     Secondary diagnosis:  Principal Problem:   False labor Active Problems:   NST (non-stress test) reactive      Discharge diagnosis: False labor                                                                                  Hospital course:  This patient was admitted for regular uterine contractions and concern for one fetal decel noted during initial NST. The patient was noted to be 1/Th/-3 at this time with regular contractions every 1 minute. Her contractions started spacing out. No augmentation was given. She was rechecked the next morning and noted to be closed/thick/high. Subjective AFI was normal. US showed cephalic presentation with fetal head not well engaged. She was continued to be monitored on FHT and it continued to be reactive with no repeat decels. Dr Emelda FearFerguson was in the room and discussed option of discharge home with return on 8/18 for IOL. Patient was agreeable to this plan.  Physical exam  Vitals:   03/11/18 2202 03/12/18 0343 03/12/18 0554 03/12/18 1005  BP: (!) 136/59 122/63 125/67 (!) 119/59  Pulse: 76 83 (!) 57 83  Resp: 18 16 18 18   Temp: 98.7 F (37.1 C) 98.2 F (36.8 C) 98.1 F (36.7 C) 98.4 F (36.9 C)  TempSrc:  Oral Oral Oral  Weight: 98 kg     Height: 5\' 9"  (1.753 m)      General: alert, cooperative and no distress Heart: RRR, no m/r/g Lungs: CTABL, no wheezing/rales Abd: gravid, nontender, BS+  Labs: Lab Results  Component Value Date   WBC 11.5 (H) 03/12/2018   HGB 13.7 03/12/2018   HCT 40.5 03/12/2018   MCV 84.6 03/12/2018   PLT 175 03/12/2018   No flowsheet data found.  Discharge instruction: per After Visit Summary and "Baby and Me Booklet".  After visit meds:   Allergies as of 03/12/2018   No Known Allergies     Medication List    TAKE these medications   prenatal multivitamin Tabs tablet Take 1 tablet by mouth daily at 12 noon.       Diet: routine diet  Activity: Advance as tolerated. Pelvic rest for 6 weeks.   Outpatient follow up: Appt on 03/16/17  Follow up Appt: Future Appointments  Date Time Provider Department Center  03/16/2018 10:15 AM [facility] NST [facility] WOC  03/19/2018 12:00 AM [facility] ROOM [facility] None   Follow up Visit:No follow-ups on file.  Disposition: Home   03/12/2018 Wendy SchatzPatricia Nicklous Aburto, MD

## 2018-03-12 NOTE — Progress Notes (Signed)
Sharen CounterLisa Leftwich-Kirby CNM notified of pt's status. Will review FM strip and get back with nurse regarding POC

## 2018-03-13 ENCOUNTER — Telehealth (HOSPITAL_COMMUNITY): Payer: Self-pay | Admitting: *Deleted

## 2018-03-13 LAB — RPR

## 2018-03-13 NOTE — Telephone Encounter (Signed)
Preadmission screen  

## 2018-03-16 ENCOUNTER — Ambulatory Visit (INDEPENDENT_AMBULATORY_CARE_PROVIDER_SITE_OTHER): Payer: BLUE CROSS/BLUE SHIELD | Admitting: *Deleted

## 2018-03-16 VITALS — BP 134/63 | HR 90 | Wt 216.1 lb

## 2018-03-16 DIAGNOSIS — O480 Post-term pregnancy: Secondary | ICD-10-CM

## 2018-03-16 NOTE — Progress Notes (Signed)
Pt was seen @ [facility] on 8/11 for possible labor and FHR tracing was reactive. Pt is scheduled for IOL on 8/18 @ midnight.  She states she was told while @ [facility] that she would have Cx exam today. Dr. Marice Potterove performed Cx exam - 1cm, thick.

## 2018-03-19 ENCOUNTER — Inpatient Hospital Stay (HOSPITAL_COMMUNITY): Payer: BLUE CROSS/BLUE SHIELD | Admitting: Anesthesiology

## 2018-03-19 ENCOUNTER — Encounter (HOSPITAL_COMMUNITY)
Disposition: A | Discharge status: Home / Self Care | Payer: Self-pay | Source: Ambulatory Visit | Attending: Obstetrics and Gynecology

## 2018-03-19 ENCOUNTER — Encounter (HOSPITAL_COMMUNITY): Payer: Self-pay

## 2018-03-19 ENCOUNTER — Inpatient Hospital Stay (HOSPITAL_COMMUNITY)
Admit: 2018-03-19 | Discharge: 2018-03-22 | DRG: 786 | Disposition: A | Discharge status: Home / Self Care | Payer: BLUE CROSS/BLUE SHIELD | Source: Ambulatory Visit | Attending: Obstetrics and Gynecology | Admitting: Obstetrics and Gynecology

## 2018-03-19 ENCOUNTER — Inpatient Hospital Stay (HOSPITAL_COMMUNITY): Admission: RE | Admit: 2018-03-19 | Payer: BLUE CROSS/BLUE SHIELD | Source: Ambulatory Visit

## 2018-03-19 DIAGNOSIS — O365930 Maternal care for other known or suspected poor fetal growth, third trimester, not applicable or unspecified: Secondary | ICD-10-CM | POA: Diagnosis present

## 2018-03-19 DIAGNOSIS — Z98891 History of uterine scar from previous surgery: Secondary | ICD-10-CM

## 2018-03-19 DIAGNOSIS — Z3A41 41 weeks gestation of pregnancy: Secondary | ICD-10-CM

## 2018-03-19 DIAGNOSIS — O411230 Chorioamnionitis, third trimester, not applicable or unspecified: Secondary | ICD-10-CM | POA: Diagnosis present

## 2018-03-19 DIAGNOSIS — O480 Post-term pregnancy: Principal | ICD-10-CM | POA: Diagnosis present

## 2018-03-19 DIAGNOSIS — O34211 Maternal care for low transverse scar from previous cesarean delivery: Secondary | ICD-10-CM | POA: Diagnosis present

## 2018-03-19 DIAGNOSIS — Z3480 Encounter for supervision of other normal pregnancy, unspecified trimester: Secondary | ICD-10-CM

## 2018-03-19 DIAGNOSIS — E669 Obesity, unspecified: Secondary | ICD-10-CM | POA: Diagnosis present

## 2018-03-19 DIAGNOSIS — O99214 Obesity complicating childbirth: Secondary | ICD-10-CM | POA: Diagnosis present

## 2018-03-19 LAB — CBC
HCT: 40.8 % (ref 36.0–46.0)
Hemoglobin: 13.7 g/dL (ref 12.0–15.0)
MCH: 28.5 pg (ref 26.0–34.0)
MCHC: 33.6 g/dL (ref 30.0–36.0)
MCV: 84.8 fL (ref 78.0–100.0)
PLATELETS: 185 10*3/uL (ref 150–400)
RBC: 4.81 MIL/uL (ref 3.87–5.11)
RDW: 13.7 % (ref 11.5–15.5)
WBC: 11.1 10*3/uL — AB (ref 4.0–10.5)

## 2018-03-19 LAB — TYPE AND SCREEN
ABO/RH(D): A POS
ANTIBODY SCREEN: NEGATIVE

## 2018-03-19 SURGERY — Surgical Case
Anesthesia: Epidural

## 2018-03-19 MED ORDER — OXYTOCIN BOLUS FROM INFUSION: 500.0000 mL | Freq: Once | INTRAVENOUS | Status: DC

## 2018-03-19 MED ORDER — LACTATED RINGERS IV SOLN: 500.0000 mL | Freq: Once | INTRAVENOUS | Status: DC

## 2018-03-19 MED ORDER — LIDOCAINE HCL (PF) 1 % IJ SOLN: 30.0000 mL | INTRAMUSCULAR | Status: DC | PRN

## 2018-03-19 MED ORDER — DIPHENHYDRAMINE HCL 50 MG/ML IJ SOLN: 12.5000 mg | INTRAMUSCULAR | Status: DC | PRN

## 2018-03-19 MED ORDER — FENTANYL 2.5 MCG/ML BUPIVACAINE 1/10 % EPIDURAL INFUSION (WH - ANES)
14.0000 mL/h | INTRAMUSCULAR | Status: DC | PRN
  Administered 2018-03-19 (×2): 14 mL/h via EPIDURAL
  Filled 2018-03-19 (×2): qty 100

## 2018-03-19 MED ORDER — FENTANYL CITRATE (PF) 100 MCG/2ML IJ SOLN
INTRAMUSCULAR | Status: AC
  Filled 2018-03-19: qty 2

## 2018-03-19 MED ORDER — LACTATED RINGERS IV SOLN
INTRAVENOUS | Status: DC
  Administered 2018-03-19 – 2018-03-20 (×4): via INTRAVENOUS

## 2018-03-19 MED ORDER — OXYTOCIN 40 UNITS IN LACTATED RINGERS INFUSION - SIMPLE MED
1.0000 m[IU]/min | INTRAVENOUS | Status: DC
  Filled 2018-03-19: qty 1000

## 2018-03-19 MED ORDER — PHENYLEPHRINE 40 MCG/ML (10ML) SYRINGE FOR IV PUSH (FOR BLOOD PRESSURE SUPPORT)
80.0000 ug | PREFILLED_SYRINGE | INTRAVENOUS | Status: DC | PRN
  Filled 2018-03-19: qty 10

## 2018-03-19 MED ORDER — CLINDAMYCIN PHOSPHATE 900 MG/50ML IV SOLN
900.0000 mg | Freq: Once | INTRAVENOUS | Status: AC
  Administered 2018-03-20: 900 mg via INTRAVENOUS
  Filled 2018-03-19: qty 50

## 2018-03-19 MED ORDER — EPHEDRINE 5 MG/ML INJ: 10.0000 mg | INTRAVENOUS | Status: DC | PRN

## 2018-03-19 MED ORDER — OXYTOCIN 40 UNITS IN LACTATED RINGERS INFUSION - SIMPLE MED
1.0000 m[IU]/min | INTRAVENOUS | Status: DC
  Administered 2018-03-19: 6 m[IU]/min via INTRAVENOUS
  Administered 2018-03-19: 2 m[IU]/min via INTRAVENOUS

## 2018-03-19 MED ORDER — OXYCODONE-ACETAMINOPHEN 5-325 MG PO TABS: 1.0000 | ORAL_TABLET | ORAL | Status: DC | PRN

## 2018-03-19 MED ORDER — SOD CITRATE-CITRIC ACID 500-334 MG/5ML PO SOLN
30.0000 mL | ORAL | Status: DC | PRN
  Administered 2018-03-19: 30 mL via ORAL
  Filled 2018-03-19: qty 15

## 2018-03-19 MED ORDER — TERBUTALINE SULFATE 1 MG/ML IJ SOLN
0.2500 mg | Freq: Once | INTRAMUSCULAR | Status: AC | PRN
  Administered 2018-03-19: 0.25 mg via SUBCUTANEOUS

## 2018-03-19 MED ORDER — PHENYLEPHRINE 40 MCG/ML (10ML) SYRINGE FOR IV PUSH (FOR BLOOD PRESSURE SUPPORT)
80.0000 ug | PREFILLED_SYRINGE | INTRAVENOUS | Status: DC | PRN
  Administered 2018-03-19: 80 ug via INTRAVENOUS

## 2018-03-19 MED ORDER — LIDOCAINE HCL (PF) 1 % IJ SOLN
INTRAMUSCULAR | Status: DC | PRN
  Administered 2018-03-19: 4 mL via EPIDURAL
  Administered 2018-03-19: 5 mL via EPIDURAL

## 2018-03-19 MED ORDER — ACETAMINOPHEN 325 MG PO TABS: 650.0000 mg | ORAL_TABLET | ORAL | Status: DC | PRN

## 2018-03-19 MED ORDER — OXYTOCIN 40 UNITS IN LACTATED RINGERS INFUSION - SIMPLE MED: 2.5000 [IU]/h | INTRAVENOUS | Status: DC

## 2018-03-19 MED ORDER — ONDANSETRON HCL 4 MG/2ML IJ SOLN: 4.0000 mg | Freq: Four times a day (QID) | INTRAMUSCULAR | Status: DC | PRN

## 2018-03-19 MED ORDER — LACTATED RINGERS IV SOLN: 500.0000 mL | INTRAVENOUS | Status: DC | PRN

## 2018-03-19 MED ORDER — SODIUM CHLORIDE 0.9 % IV SOLN
2.0000 g | Freq: Four times a day (QID) | INTRAVENOUS | Status: DC
  Administered 2018-03-19: 2 g via INTRAVENOUS
  Filled 2018-03-19: qty 2000
  Filled 2018-03-19: qty 2
  Filled 2018-03-19: qty 2000

## 2018-03-19 MED ORDER — BUPIVACAINE HCL (PF) 0.5 % IJ SOLN
INTRAMUSCULAR | Status: AC
  Filled 2018-03-19: qty 30

## 2018-03-19 MED ORDER — OXYCODONE-ACETAMINOPHEN 5-325 MG PO TABS: 2.0000 | ORAL_TABLET | ORAL | Status: DC | PRN

## 2018-03-19 MED ORDER — DEXTROSE 5 % IV SOLN
5.0000 mg/kg | INTRAVENOUS | Status: DC
  Administered 2018-03-19: 400 mg via INTRAVENOUS
  Filled 2018-03-19: qty 10

## 2018-03-19 MED ORDER — TERBUTALINE SULFATE 1 MG/ML IJ SOLN
0.2500 mg | Freq: Once | INTRAMUSCULAR | Status: DC | PRN
  Filled 2018-03-19: qty 1

## 2018-03-19 SURGICAL SUPPLY — 35 items
BENZOIN TINCTURE PRP APPL 2/3 (GAUZE/BANDAGES/DRESSINGS) ×2 IMPLANT
CHLORAPREP W/TINT 26ML (MISCELLANEOUS) ×2 IMPLANT
CLAMP CORD UMBIL (MISCELLANEOUS) IMPLANT
CLOTH BEACON ORANGE TIMEOUT ST (SAFETY) ×2 IMPLANT
CLSR STERI-STRIP ANTIMIC 1/2X4 (GAUZE/BANDAGES/DRESSINGS) ×2 IMPLANT
DRSG OPSITE POSTOP 4X10 (GAUZE/BANDAGES/DRESSINGS) ×2 IMPLANT
ELECT REM PT RETURN 9FT ADLT (ELECTROSURGICAL) ×2
ELECTRODE REM PT RTRN 9FT ADLT (ELECTROSURGICAL) ×1 IMPLANT
EXTRACTOR VACUUM BELL STYLE (SUCTIONS) IMPLANT
GLOVE BIOGEL PI IND STRL 6.5 (GLOVE) ×1 IMPLANT
GLOVE BIOGEL PI IND STRL 7.0 (GLOVE) ×2 IMPLANT
GLOVE BIOGEL PI INDICATOR 6.5 (GLOVE) ×1
GLOVE BIOGEL PI INDICATOR 7.0 (GLOVE) ×2
GLOVE ORTHOPEDIC STR SZ6.5 (GLOVE) ×2 IMPLANT
GOWN STRL REUS W/TWL LRG LVL3 (GOWN DISPOSABLE) ×6 IMPLANT
KIT ABG SYR 3ML LUER SLIP (SYRINGE) IMPLANT
NEEDLE HYPO 22GX1.5 SAFETY (NEEDLE) ×2 IMPLANT
NEEDLE HYPO 25X1 1.5 SAFETY (NEEDLE) IMPLANT
NS IRRIG 1000ML POUR BTL (IV SOLUTION) ×2 IMPLANT
PACK C SECTION WH (CUSTOM PROCEDURE TRAY) ×2 IMPLANT
PAD ABD 8X7 1/2 STERILE (GAUZE/BANDAGES/DRESSINGS) ×4 IMPLANT
PAD OB MATERNITY 4.3X12.25 (PERSONAL CARE ITEMS) ×2 IMPLANT
PENCIL SMOKE EVAC W/HOLSTER (ELECTROSURGICAL) ×2 IMPLANT
SPONGE GAUZE 4X4 12PLY STER LF (GAUZE/BANDAGES/DRESSINGS) ×6 IMPLANT
STRIP CLOSURE SKIN 1/2X4 (GAUZE/BANDAGES/DRESSINGS) IMPLANT
SUT MON AB 4-0 PS1 27 (SUTURE) ×2 IMPLANT
SUT PLAIN 2 0 (SUTURE) ×1
SUT PLAIN ABS 2-0 CT1 27XMFL (SUTURE) ×1 IMPLANT
SUT VIC AB 0 CT1 36 (SUTURE) ×4 IMPLANT
SUT VIC AB 0 CTX 36 (SUTURE) ×1
SUT VIC AB 0 CTX36XBRD ANBCTRL (SUTURE) ×1 IMPLANT
SYR CONTROL 10ML LL (SYRINGE) ×2 IMPLANT
TAPE CLOTH SURG 4X10 WHT LF (GAUZE/BANDAGES/DRESSINGS) ×2 IMPLANT
TOWEL OR 17X24 6PK STRL BLUE (TOWEL DISPOSABLE) ×2 IMPLANT
TRAY FOLEY W/BAG SLVR 14FR LF (SET/KITS/TRAYS/PACK) ×2 IMPLANT

## 2018-03-19 NOTE — Progress Notes (Signed)
POC discussed with pt. Pt verbalizes understanding and will notify RN of discomfort, pain, ROM, and bleeding.   

## 2018-03-19 NOTE — Progress Notes (Addendum)
Labor Progress Note Wendy Aguilar is a 17 y.o. G2P1001 at 4673w1d presented for IOL due to post dates. Due date: 03/11/2018  S:  Hypotension following placement of epidural. Phenylephrine administered, pit held, fetal irritation on EFM.   O:  BP (!) 106/56   Pulse 81   Temp 98.4 F (36.9 C) (Oral)   Resp 18   Ht 5\' 9"  (1.753 m)   Wt 98.9 kg   LMP 06/04/2017 (Approximate)   BMI 32.21 kg/m  EFM: 140/minimal var/no accels/early decels  Category II  CVE: Dilation: 5.5 Effacement (%): 50, 60 Cervical Position: Posterior Station: -3 Presentation: Vertex Exam by:: Milus GlazierJennifer Hamilton RN   A&P: 17 y.o. G2P1001 5073w1d admitted for IOL for post dates delivery.  #Labor: progressing. [name] #Pain: epidural #FWB: Category II tracing, will continue to monitor #GBS negative   Mirian MoPeter Frank, MD 5:05 PM

## 2018-03-19 NOTE — Anesthesia Preprocedure Evaluation (Addendum)
Anesthesia Evaluation  Patient identified by MRN, date of birth, ID band Patient awake    Reviewed: Allergy & Precautions, Patient's Chart, lab work & pertinent test results  Airway Mallampati: II  TM Distance: >3 FB Neck ROM: Full    Dental no notable dental hx. (+) Teeth Intact   Pulmonary neg pulmonary ROS,    Pulmonary exam normal breath sounds clear to auscultation       Cardiovascular negative cardio ROS Normal cardiovascular exam Rhythm:Regular Rate:Normal     Neuro/Psych negative neurological ROS  negative psych ROS   GI/Hepatic Neg liver ROS, GERD  ,  Endo/Other  Obesity  Renal/GU negative Renal ROS  negative genitourinary   Musculoskeletal negative musculoskeletal ROS (+)   Abdominal (+) + obese,   Peds  Hematology   Anesthesia Other Findings PATIENT IRRATIONAL AND UNABLE TO PROPERLY EVALUATE EVEN THE LEVEL OF THE EPIDURAL IN SPITE OF PATIENT NOT BEING ABLE TO MOVE HER LEGS.  I HAVE ELECTED TO INDUCE HER PRIOR TO PREP BECAUSE SHE IS A DAGER TO HERSELF AND HER BABY.  Reproductive/Obstetrics (+) Pregnancy Post dates Previous C/Section Attempting VBAC                            Anesthesia Physical Anesthesia Plan  ASA: II  Anesthesia Plan: Epidural   Post-op Pain Management:    Induction:   PONV Risk Score and Plan:   Airway Management Planned: Natural Airway  Additional Equipment:   Intra-op Plan:   Post-operative Plan:   Informed Consent: I have reviewed the patients History and Physical, chart, labs and discussed the procedure including the risks, benefits and alternatives for the proposed anesthesia with the patient or authorized representative who has indicated his/her understanding and acceptance.     Plan Discussed with: Anesthesiologist  Anesthesia Plan Comments:         Anesthesia Quick Evaluation

## 2018-03-19 NOTE — Progress Notes (Signed)
OB/GYN Faculty Practice: Labor Progress Note  Subjective: Tearful. Worried about pain with C/S. Had a lot of anxiety last C/S because emergent.   Objective: BP 125/60   Pulse (!) 101   Temp 98.4 F (36.9 C) (Oral)   Resp 16   Ht 5\' 9"  (1.753 m)   Wt 98.9 kg   LMP 06/04/2017 (Approximate)   BMI 32.21 kg/m  Gen: tearful, partner and mother in room, lying on side  Dilation: 5.5 Effacement (%): 80 Cervical Position: Posterior Station: -2 Presentation: Vertex Exam by:: Dr. Earlene PlaterWallace  Assessment and Plan: 17 y.o. G2P1001 716w1d here for PDIOL.   Labor: TOLAC. Early labor. Long conversation with patient regarding possibility of repeat C/S given combination of factors including presumed chorioamnionitis, remote from delivery and fetal intolerance of pitocin.  -- pain control: epidural in place, still feeling pressure   Chorioamnionitis: Given maternal and fetal tachycardia, will start antibiotics for presumed chorio.  -- amp/gent  Fetal Well-Being: Baby boy Wendy Aguilar. EFW 6-7lbs by Leopold's. Cephalic by sutures.  -- Category II - continuous fetal monitoring - responded to fetal scalp stim but now with decreasing variability. Less late decelerations right after terb and with bolus/position change but now tachycardia and less variability.  -- GBS (-)   Wendy Aguilar S. Earlene PlaterWallace, DO OB/GYN Fellow 11:26 PM

## 2018-03-19 NOTE — Anesthesia Procedure Notes (Signed)
Epidural Patient location during procedure: OB Start time: 03/19/2018 3:00 PM End time: 03/19/2018 3:12 PM  Staffing Anesthesiologist: Mal AmabileFoster, Jadae Steinke, MD Performed: anesthesiologist   Preanesthetic Checklist Completed: patient identified, site marked, surgical consent, pre-op evaluation, timeout performed, IV checked, risks and benefits discussed and monitors and equipment checked  Epidural Patient position: sitting Prep: site prepped and draped and DuraPrep Patient monitoring: continuous pulse ox and blood pressure Approach: midline Location: L3-L4 Injection technique: LOR air  Needle:  Needle type: Tuohy  Needle gauge: 17 G Needle length: 9 cm and 9 Needle insertion depth: 7 cm Catheter type: closed end flexible Catheter size: 19 Gauge Catheter at skin depth: 12 cm Test dose: negative and Other  Assessment Events: blood not aspirated, injection not painful, no injection resistance, negative IV test and paresthesia  Additional Notes Patient identified. Risks and benefits discussed including failed block, incomplete  Pain control, post dural puncture headache, nerve damage, paralysis, blood pressure Changes, nausea, vomiting, reactions to medications-both toxic and allergic and post Partum back pain. All questions were answered. Patient expressed understanding and wished to proceed. Sterile technique was used throughout procedure. Epidural site was Dressed with sterile barrier dressing. No signs of intravascular injection Or signs of intrathecal spread were encountered. Transient paresthesia on attempting to thread catheter, Needle repositioned, no paresthesia on threading catheter. Patient was more comfortable after the epidural was dosed. Please see RN's note for documentation of vital signs and FHR which are stable.

## 2018-03-19 NOTE — H&P (Signed)
LABOR AND DELIVERY ADMISSION HISTORY AND PHYSICAL NOTE  Wendy Aguilar is a 17 y.o. female G2P1001 with IUP at 4681w1d by u/s presenting for IOL for postdates. Prior pregnancy was uncomplicated and delivered via c-section. She reports positive fetal movement. She denies leakage of fluid or vaginal bleeding. She plans to breast feed. She plans for progesterone-only pills for contraception.She plans for outpatient circumcision.   [facility]Com[facility] Pregnancy complications:  - History of C-section  Past Medical History: Past Medical History:  Diagnosis Date  . Medical history non-contributory     Past Surgical History: Past Surgical History:  Procedure Laterality Date  . CESAREAN SECTION N/A 10/10/2016   Procedure: CESAREAN SECTION;  Surgeon: Adam PhenixJames G Arnold, MD;  [facility];  Service: Obstetrics;  Laterality: N/A;    Obstetrical History: OB History    Gravida  2   Para  1   Term  1   Preterm      AB      Living  1     SAB      TAB      Ectopic      Multiple  0   Live Births  1           Social History: Social History   Socioeconomic History  . Marital status: Single    Spouse name: Not on file  . Number of children: Not on file  . Years of education: Not on file  . Highest education level: Not on file  Occupational History  . Not on file  Social Needs  . Financial resource strain: Not hard at all  . Food insecurity:    Worry: Never true    Inability: Never true  . Transportation needs:    Medical: No    Non-medical: No  Tobacco Use  . Smoking status: Never Smoker  . Smokeless tobacco: Never Used  Substance and Sexual Activity  . Alcohol use: No  . Drug use: No  . Sexual activity: Yes    Birth control/protection: None  Lifestyle  . Physical activity:    Days per week: Not on file    Minutes per session: Not on file  . Stress: Not on file  Relationships  . Social connections:    Talks on phone:  Not on file    Gets together: Not on file    Attends religious service: Not on file    Active member of club or organization: Not on file    Attends meetings of clubs or organizations: Not on file    Relationship status: Not on file  Other Topics Concern  . Not on file  Social History Narrative  . Not on file    Family History: Family History  Problem Relation Age of Onset  . Alcohol abuse Neg Hx   . Arthritis Neg Hx   . Asthma Neg Hx   . Birth defects Neg Hx   . COPD Neg Hx   . Depression Neg Hx   . Diabetes Neg Hx   . Drug abuse Neg Hx   . Early death Neg Hx   . Hearing loss Neg Hx   . Heart disease Neg Hx   . Hyperlipidemia Neg Hx   . Hypertension Neg Hx   . Kidney disease Neg Hx   . Learning disabilities Neg Hx   . Mental illness Neg Hx   . Mental retardation Neg Hx   . Miscarriages / Stillbirths Neg Hx   .  Stroke Neg Hx   . Vision loss Neg Hx   . Varicose Veins Neg Hx     Allergies: No Known Allergies   (Not in a hospital admission)   Review of Systems  All systems reviewed and negative except as stated in HPI  Physical Exam Last menstrual period 06/04/2017, unknown if currently breastfeeding. General appearance: alert, oriented, NAD Lungs: normal respiratory effort Heart: regular rate Abdomen: soft, non-tender; gravid, FH appropriate for GA Extremities: No calf swelling or tenderness Presentation: cephalic Fetal monitoring: baseline rate 130 bpm, moderate variability, +acels, no decels Uterine activity: irregular    Prenatal labs: ABO, Rh: --/--/A POS (08/11 0240) Antibody: NEG (08/11 0240) Rubella: 2.74 (03/22 1616) RPR: SPHEMO (08/11 0239)  HBsAg: Negative (03/22 1616)  HIV: Non Reactive (05/16 0829)  /GC/Chlamydia: Negative (07/11) GBS:   Negative (07/11) 2-hr GTT: Passed (05/16) Genetic screening:  n/a Anatomy US: Normal   Prenatal Transfer Tool  Maternal Diabetes: No Genetic Screening: Declined Maternal Ultrasounds/Referrals:  Normal Fetal Ultrasounds or other Referrals:  None Maternal Substance Abuse:  No Significant Maternal Medications:  None Significant Maternal Lab Results: Lab values include: Group B Strep negative  No results found for this or any previous visit (from the past 24 hour(s)).  Patient Active Problem List   Diagnosis Date Noted  . False labor 03/12/2018  . NST (non-stress test) reactive 03/12/2018  . Encounter for fetal anatomic survey   . Late prenatal care affecting pregnancy in second trimester   . Supervision of other normal pregnancy, antepartum 10/21/2017  . History of cesarean section 10/21/2017    Assessment: Wendy Aguilar is a 17 y.o. G2P1001 at 7540w1d here for IOL for postdates. Her pregnancy has been uncomplicated. She would like TOLAC. Plan for induction with Pitocin.  #Labor: Pitocin #Pain: Epidural if patient requests #FWB: Cat I #ID: GBS negative #MOF: Breast  #MOC: POP #Circ:  Yes (outpatient)  Natasha MeadBryan A Chadwick, MS3 03/19/2018, 12:01 AM   OB FELLOW MEDICAL STUDENT NOTE ATTESTATION  I confirm that I have verified the information documented in the medical student's note and that I have also personally performed the physical exam and all medical decision making activities.   Wendy Aguilar is 17 y.o. 602P1001 female at 2840w1d who presents for IOL for postdates. Patient is TOLAC. Prior C-section for fetal intolerance. This pregnancy uncomplicated. Patient is GBS negative. Baby boy, plans for outpatient circ. Plans to breast feed and use POP for contraception.   Dilation: 1 Effacement (%): 50 Cervical Position: Posterior Station: -3 Presentation: Vertex Exam by:: Belinda FisherZ. Parrish, RN   Vertex presentation confirmed with bedside sono.  FB placed to start induction with use of sterile speculum and ring forceps. AROM at time of FB placement with clear fluid.   FWB Cat I. Anticipate NSVD.   Marcy Sirenatherine Wallace, D.O. OB Fellow  03/19/2018, 7:07 AM

## 2018-03-19 NOTE — Progress Notes (Signed)
RN confirmed with DO that pitocin is held until provider gives the okay to start it.

## 2018-03-19 NOTE — Progress Notes (Signed)
OB/GYN Faculty Practice: Labor Progress Note  Subjective: Feeling more comfortable, changing positions.   Objective: BP 124/71   Pulse (!) 103   Temp 98.4 F (36.9 C) (Oral)   Resp 18   Ht 5\' 9"  (1.753 m)   Wt 98.9 kg   LMP 06/04/2017 (Approximate)   BMI 32.21 kg/m  Gen: tearful Dilation: 5.5 Effacement (%): 80 Cervical Position: Posterior Station: -2 Presentation: Vertex Exam by:: Dr. Earlene PlaterWallace  Assessment and Plan: 17 y.o. G2P1001 732w1d here for PDIOL.   Labor: Induction for post-dates. TOLAC.  -- pain control: epidural in place -- counseled on possibility of needing repeat C/S given combination of factors including recurrent decelerations, unchanged cervix for about 12 hours, ROM for nearly 18 hours   Fetal Well-Being: Baby boy [place]Josiah. EFW 6-7lbs by Leopold's. Cephalic by sutures.  -- Category II - continuous fetal monitoring - recurrent late decelerations with good variability, trying position changes and bolus. Do not think amnioinfusion would help with late decelerations. Pit was turned off. Will give terb now.  -- GBS (-)   Posie Lillibridge S. Earlene PlaterWallace, DO OB/GYN Fellow 9:50 PM

## 2018-03-19 NOTE — Progress Notes (Signed)
Faculty Note  In to review course, tracing, chorio diagnosis with patient and family. Reviewed that she has not made change in 12 hours but as long as baby tolerates, would continue induction. Reviewed if baby is not tolerating labor, would recommend proceeding with repeat c-section. Antibiotics running in and FHR with some decreased variability however late decels stopped after pit off and terbutaline. Cervical exam unchanged. Patient very concerned about feeling pain during c-section.   Baldemar LenisK. Meryl Kylian Loh, M.D. [facility] 10:45 pm   Patient now s/p amp and gent is running in. Fetus continues to be tachycardic and is having late decels despite O2. Also with increasingly decreased variability. Given she is remote from delivery, has not made change on pitocin and fetal HR remains tachycardic, recommend proceeding with repeat c-section. She is agreeable.   The risks of cesarean section were discussed with the patient; including but not limited to: infection which may require antibiotics; bleeding which may require transfusion or re-operation; injury to bowel, bladder, ureters or other surrounding organs; need for additional procedures; placental abnormalities wth subsequent pregnancies, risk of needing c-sections in future pregnancies, incisional problems, thromboembolic phenomenon and other postoperative/anesthesia complications. Answered all questions. The patient verbalized understanding of the plan, giving informed consent for the procedure. She is agreeable to blood transfusion in the event of emergency.  NPO Add clinda To OR when ready Anesthesia aware   K. Therese SarahMeryl Amayiah Gosnell, M.D. [facility]

## 2018-03-19 NOTE — Progress Notes (Signed)
Wendy Aguilar is a 17 y.o. G2P1001 at 3436w1d by ultrasound admitted for induction of labor due to Post dates. Due date 03/11/18.  Subjective:   Objective: BP 117/66 (BP Location: Left Arm)   Pulse 85   Temp 98.4 F (36.9 C) (Oral)   Resp 18   Ht 5\' 9"  (1.753 m)   Wt 98.9 kg   LMP 06/04/2017 (Approximate)   BMI 32.21 kg/m  I/O last 3 completed shifts: In: -  Out: 2 [Urine:2] No intake/output data recorded.  FHT:  FHR: 140's bpm, variability: moderate,  accelerations:  Present,  decelerations:  Absent UC:   regular, every 2 minutes SVE:   Dilation: 5.5 Effacement (%): 60 Station: -3 Exam by:: Belinda FisherZ. Parrish, RN  Labs: Lab Results  Component Value Date   WBC 11.1 (H) 03/19/2018   HGB 13.7 03/19/2018   HCT 40.8 03/19/2018   MCV 84.8 03/19/2018   PLT 185 03/19/2018    Assessment / Plan: Induction of labor due to postterm,  progressing well on pitocin  Labor: Progressing normally Preeclampsia:  no signs or symptoms of toxicity Fetal Wellbeing:  Category I Pain Control:  Labor support without medications I/D:  n/a Anticipated MOD:  NSVD  Wyvonnia DuskyMarie Mingo Siegert 03/19/2018, 11:39 AM

## 2018-03-20 ENCOUNTER — Encounter (HOSPITAL_COMMUNITY): Payer: Self-pay

## 2018-03-20 DIAGNOSIS — O411230 Chorioamnionitis, third trimester, not applicable or unspecified: Secondary | ICD-10-CM

## 2018-03-20 DIAGNOSIS — O34211 Maternal care for low transverse scar from previous cesarean delivery: Secondary | ICD-10-CM

## 2018-03-20 DIAGNOSIS — Z3A41 41 weeks gestation of pregnancy: Secondary | ICD-10-CM

## 2018-03-20 DIAGNOSIS — O480 Post-term pregnancy: Secondary | ICD-10-CM

## 2018-03-20 LAB — CBC
HCT: 35.8 % — ABNORMAL LOW (ref 36.0–46.0)
HEMOGLOBIN: 12.2 g/dL (ref 12.0–15.0)
MCH: 29 pg (ref 26.0–34.0)
MCHC: 34.1 g/dL (ref 30.0–36.0)
MCV: 85.2 fL (ref 78.0–100.0)
Platelets: 158 10*3/uL (ref 150–400)
RBC: 4.2 MIL/uL (ref 3.87–5.11)
RDW: 13.6 % (ref 11.5–15.5)
WBC: 29 10*3/uL — AB (ref 4.0–10.5)

## 2018-03-20 LAB — CREATININE, SERUM: CREATININE: 0.63 mg/dL (ref 0.44–1.00)

## 2018-03-20 LAB — RPR

## 2018-03-20 MED ORDER — OXYCODONE HCL 5 MG PO TABS
5.0000 mg | ORAL_TABLET | ORAL | Status: DC | PRN
  Administered 2018-03-20: 5 mg via ORAL
  Filled 2018-03-20 (×3): qty 1

## 2018-03-20 MED ORDER — KETOROLAC TROMETHAMINE 30 MG/ML IJ SOLN: 30.0000 mg | Freq: Four times a day (QID) | INTRAMUSCULAR | Status: AC | PRN

## 2018-03-20 MED ORDER — ACETAMINOPHEN 325 MG PO TABS: 650.0000 mg | ORAL_TABLET | ORAL | Status: DC | PRN

## 2018-03-20 MED ORDER — DIPHENHYDRAMINE HCL 25 MG PO CAPS: 25.0000 mg | ORAL_CAPSULE | Freq: Four times a day (QID) | ORAL | Status: DC | PRN

## 2018-03-20 MED ORDER — SENNOSIDES-DOCUSATE SODIUM 8.6-50 MG PO TABS
2.0000 | ORAL_TABLET | ORAL | Status: DC
  Administered 2018-03-20 – 2018-03-21 (×2): 2 via ORAL
  Filled 2018-03-20 (×2): qty 2

## 2018-03-20 MED ORDER — ENOXAPARIN SODIUM 60 MG/0.6ML ~~LOC~~ SOLN
0.5000 mg/kg | SUBCUTANEOUS | Status: DC
  Administered 2018-03-20: 50 mg via SUBCUTANEOUS
  Filled 2018-03-20 (×3): qty 0.6

## 2018-03-20 MED ORDER — ONDANSETRON HCL 4 MG/2ML IJ SOLN
INTRAMUSCULAR | Status: DC | PRN
  Administered 2018-03-20: 4 mg via INTRAVENOUS

## 2018-03-20 MED ORDER — MIDAZOLAM HCL 2 MG/2ML IJ SOLN
INTRAMUSCULAR | Status: AC
  Filled 2018-03-20: qty 2

## 2018-03-20 MED ORDER — DIPHENHYDRAMINE HCL 25 MG PO CAPS
25.0000 mg | ORAL_CAPSULE | ORAL | Status: DC | PRN
  Filled 2018-03-20: qty 1

## 2018-03-20 MED ORDER — NALOXONE HCL 4 MG/10ML IJ SOLN
1.0000 ug/kg/h | INTRAVENOUS | Status: DC | PRN
  Filled 2018-03-20: qty 5

## 2018-03-20 MED ORDER — PRENATAL MULTIVITAMIN CH
1.0000 | ORAL_TABLET | Freq: Every day | ORAL | Status: DC
  Administered 2018-03-20 – 2018-03-22 (×3): 1 via ORAL
  Filled 2018-03-20 (×3): qty 1

## 2018-03-20 MED ORDER — FENTANYL CITRATE (PF) 100 MCG/2ML IJ SOLN
INTRAMUSCULAR | Status: AC
  Filled 2018-03-20: qty 2

## 2018-03-20 MED ORDER — SODIUM BICARBONATE 8.4 % IV SOLN
INTRAVENOUS | Status: DC | PRN
  Administered 2018-03-19 – 2018-03-20 (×3): 5 mL via EPIDURAL

## 2018-03-20 MED ORDER — MEPERIDINE HCL 25 MG/ML IJ SOLN: 6.2500 mg | INTRAMUSCULAR | Status: DC | PRN

## 2018-03-20 MED ORDER — SCOPOLAMINE 1 MG/3DAYS TD PT72
MEDICATED_PATCH | TRANSDERMAL | Status: AC
  Filled 2018-03-20: qty 1

## 2018-03-20 MED ORDER — OXYCODONE HCL 5 MG/5ML PO SOLN: 5.0000 mg | Freq: Once | ORAL | Status: DC | PRN

## 2018-03-20 MED ORDER — COCONUT OIL OIL: 1.0000 "application " | TOPICAL_OIL | Status: DC | PRN

## 2018-03-20 MED ORDER — TETANUS-DIPHTH-ACELL PERTUSSIS 5-2.5-18.5 LF-MCG/0.5 IM SUSP: 0.5000 mL | Freq: Once | INTRAMUSCULAR | Status: DC

## 2018-03-20 MED ORDER — PHENYLEPHRINE HCL 10 MG/ML IJ SOLN
INTRAMUSCULAR | Status: DC | PRN
  Administered 2018-03-20: 80 mg via INTRAVENOUS

## 2018-03-20 MED ORDER — SODIUM CHLORIDE 0.9 % IR SOLN
Status: DC | PRN
  Administered 2018-03-20: 1000 mL

## 2018-03-20 MED ORDER — ZOLPIDEM TARTRATE 5 MG PO TABS: 5.0000 mg | ORAL_TABLET | Freq: Every evening | ORAL | Status: DC | PRN

## 2018-03-20 MED ORDER — ACETAMINOPHEN 325 MG PO TABS: 325.0000 mg | ORAL_TABLET | ORAL | Status: DC | PRN

## 2018-03-20 MED ORDER — PROPOFOL 10 MG/ML IV BOLUS
INTRAVENOUS | Status: DC | PRN
  Administered 2018-03-20: 200 mg via INTRAVENOUS

## 2018-03-20 MED ORDER — FENTANYL CITRATE (PF) 100 MCG/2ML IJ SOLN
INTRAMUSCULAR | Status: DC | PRN
  Administered 2018-03-20: 100 ug via EPIDURAL
  Administered 2018-03-20: 100 ug via INTRAVENOUS

## 2018-03-20 MED ORDER — ONDANSETRON HCL 4 MG/2ML IJ SOLN
INTRAMUSCULAR | Status: AC
  Filled 2018-03-20: qty 2

## 2018-03-20 MED ORDER — ACETAMINOPHEN 160 MG/5ML PO SOLN: 325.0000 mg | ORAL | Status: DC | PRN

## 2018-03-20 MED ORDER — FENTANYL CITRATE (PF) 250 MCG/5ML IJ SOLN
INTRAMUSCULAR | Status: AC
  Filled 2018-03-20: qty 5

## 2018-03-20 MED ORDER — OXYTOCIN 40 UNITS IN LACTATED RINGERS INFUSION - SIMPLE MED: 2.5000 [IU]/h | INTRAVENOUS | Status: AC

## 2018-03-20 MED ORDER — LIDOCAINE HCL (CARDIAC) PF 100 MG/5ML IV SOSY
PREFILLED_SYRINGE | INTRAVENOUS | Status: DC | PRN
  Administered 2018-03-20: 75 mg via INTRAVENOUS

## 2018-03-20 MED ORDER — OXYTOCIN 10 UNIT/ML IJ SOLN
INTRAVENOUS | Status: DC | PRN
  Administered 2018-03-20: 40 [IU] via INTRAVENOUS

## 2018-03-20 MED ORDER — NALBUPHINE HCL 10 MG/ML IJ SOLN: 5.0000 mg | INTRAMUSCULAR | Status: DC | PRN

## 2018-03-20 MED ORDER — ONDANSETRON HCL 4 MG/2ML IJ SOLN: 4.0000 mg | Freq: Three times a day (TID) | INTRAMUSCULAR | Status: DC | PRN

## 2018-03-20 MED ORDER — SUCCINYLCHOLINE CHLORIDE 20 MG/ML IJ SOLN
INTRAMUSCULAR | Status: DC | PRN
  Administered 2018-03-20: 100 mg via INTRAVENOUS

## 2018-03-20 MED ORDER — SCOPOLAMINE 1 MG/3DAYS TD PT72: 1.0000 | MEDICATED_PATCH | Freq: Once | TRANSDERMAL | Status: DC

## 2018-03-20 MED ORDER — SIMETHICONE 80 MG PO CHEW
80.0000 mg | CHEWABLE_TABLET | ORAL | Status: DC
  Administered 2018-03-21 (×2): 80 mg via ORAL
  Filled 2018-03-20 (×2): qty 1

## 2018-03-20 MED ORDER — LIDOCAINE HCL (CARDIAC) PF 100 MG/5ML IV SOSY
PREFILLED_SYRINGE | INTRAVENOUS | Status: AC
  Filled 2018-03-20: qty 5

## 2018-03-20 MED ORDER — NALBUPHINE HCL 10 MG/ML IJ SOLN: 5.0000 mg | Freq: Once | INTRAMUSCULAR | Status: DC | PRN

## 2018-03-20 MED ORDER — SUCCINYLCHOLINE CHLORIDE 200 MG/10ML IV SOSY
PREFILLED_SYRINGE | INTRAVENOUS | Status: AC
  Filled 2018-03-20: qty 10

## 2018-03-20 MED ORDER — PROPOFOL 10 MG/ML IV BOLUS
INTRAVENOUS | Status: AC
  Filled 2018-03-20: qty 20

## 2018-03-20 MED ORDER — MIDAZOLAM HCL 2 MG/2ML IJ SOLN
INTRAMUSCULAR | Status: DC | PRN
  Administered 2018-03-20: 2 mg via INTRAVENOUS

## 2018-03-20 MED ORDER — NALOXONE HCL 0.4 MG/ML IJ SOLN: 0.4000 mg | INTRAMUSCULAR | Status: DC | PRN

## 2018-03-20 MED ORDER — WITCH HAZEL-GLYCERIN EX PADS: 1.0000 "application " | MEDICATED_PAD | CUTANEOUS | Status: DC | PRN

## 2018-03-20 MED ORDER — KETOROLAC TROMETHAMINE 30 MG/ML IJ SOLN
30.0000 mg | Freq: Four times a day (QID) | INTRAMUSCULAR | Status: AC | PRN
  Administered 2018-03-20: 30 mg via INTRAVENOUS
  Filled 2018-03-20: qty 1

## 2018-03-20 MED ORDER — DEXAMETHASONE SODIUM PHOSPHATE 4 MG/ML IJ SOLN
INTRAMUSCULAR | Status: AC
  Filled 2018-03-20: qty 1

## 2018-03-20 MED ORDER — SIMETHICONE 80 MG PO CHEW
80.0000 mg | CHEWABLE_TABLET | Freq: Three times a day (TID) | ORAL | Status: DC
  Administered 2018-03-20 – 2018-03-22 (×6): 80 mg via ORAL
  Filled 2018-03-20 (×7): qty 1

## 2018-03-20 MED ORDER — MENTHOL 3 MG MT LOZG: 1.0000 | LOZENGE | OROMUCOSAL | Status: DC | PRN

## 2018-03-20 MED ORDER — DIPHENHYDRAMINE HCL 50 MG/ML IJ SOLN: 12.5000 mg | INTRAMUSCULAR | Status: DC | PRN

## 2018-03-20 MED ORDER — OXYCODONE HCL 5 MG PO TABS
10.0000 mg | ORAL_TABLET | ORAL | Status: DC | PRN
  Administered 2018-03-20 – 2018-03-21 (×4): 10 mg via ORAL
  Filled 2018-03-20 (×4): qty 2

## 2018-03-20 MED ORDER — PHENYLEPHRINE 40 MCG/ML (10ML) SYRINGE FOR IV PUSH (FOR BLOOD PRESSURE SUPPORT)
PREFILLED_SYRINGE | INTRAVENOUS | Status: AC
  Filled 2018-03-20: qty 10

## 2018-03-20 MED ORDER — IBUPROFEN 600 MG PO TABS
600.0000 mg | ORAL_TABLET | Freq: Four times a day (QID) | ORAL | Status: DC
  Administered 2018-03-20 – 2018-03-22 (×9): 600 mg via ORAL
  Filled 2018-03-20 (×9): qty 1

## 2018-03-20 MED ORDER — FENTANYL CITRATE (PF) 100 MCG/2ML IJ SOLN
25.0000 ug | INTRAMUSCULAR | Status: DC | PRN
  Administered 2018-03-20: 25 ug via INTRAVENOUS

## 2018-03-20 MED ORDER — LACTATED RINGERS IV SOLN
INTRAVENOUS | Status: DC
  Administered 2018-03-20 (×2): via INTRAVENOUS

## 2018-03-20 MED ORDER — SODIUM CHLORIDE 0.9% FLUSH: 3.0000 mL | INTRAVENOUS | Status: DC | PRN

## 2018-03-20 MED ORDER — DIBUCAINE 1 % RE OINT: 1.0000 "application " | TOPICAL_OINTMENT | RECTAL | Status: DC | PRN

## 2018-03-20 MED ORDER — ONDANSETRON HCL 4 MG/2ML IJ SOLN: 4.0000 mg | Freq: Once | INTRAMUSCULAR | Status: DC | PRN

## 2018-03-20 MED ORDER — OXYCODONE HCL 5 MG PO TABS: 5.0000 mg | ORAL_TABLET | Freq: Once | ORAL | Status: DC | PRN

## 2018-03-20 MED ORDER — SIMETHICONE 80 MG PO CHEW: 80.0000 mg | CHEWABLE_TABLET | ORAL | Status: DC | PRN

## 2018-03-20 MED ORDER — BUPIVACAINE HCL (PF) 0.5 % IJ SOLN
INTRAMUSCULAR | Status: DC | PRN
  Administered 2018-03-20: 20 mL

## 2018-03-20 NOTE — Addendum Note (Signed)
Addendum  created 03/20/18 0820 by Elgie CongoMalinova, Sheilah Rayos H, CRNA   Sign clinical note

## 2018-03-20 NOTE — Lactation Note (Signed)
This note was copied from a baby's chart. Lactation Consultation Note  Patient Name: Wendy Aguilar BJYNW'GToday's Date: 03/20/2018 Reason for consult: Initial assessment;1st time breastfeeding;Term   Initial consult with mom of 11 hour old infant. Infant with 4 BF attempts, EBM x 2 gtts-2 ml via spoon, and 4 stools since birth. LATCH scores 5-6. Infant weight 7 pounds 5.8 ounces. Mom holding infant STS and he is in a deep sleep. Mom reports infant just fed for < 1 minutes, infant has colostrum dried around mouth. Infant not willing to relatch. Infant gaggy and spitting up clear mucous. Discussed with mom that it is not uncommon for some infants to not feed well in the first 24 hours.   Mom reports she BF her 1.5 yo for 1/2 day. Mom had difficulty with bleeding and then reports it was painful to BF. She changed to bottle. She is planning to try BF this time and then change to formula if it does not work out. Reviewed with mom that we can set her up a pump if infant continues to not want to feed.   Enc mom to BF infant STS 8-12 x in 24 hours at first feeding cues. Enc mom to continue to hand express and offer colostrum to infant. Enc mom to keep infant awake at the breast as needed and to massage/compress breast with feedings. Enc mom to call out for feeding assistance as needed. Mom reports she is able to hand express colostrum, she has a few gtts on a spoon in the room, enc mom to finger feed to infant, which she did. Reviewed supply and demand and importance of stimulating breasts to promote milk production.   BF Resources handout and LC Brochure given, mom informed of IP/OP Services, BF Support Groups and LC phone #. Mom reports she has no further questions/concerns at this time. Mom feeling sleepy, enc mom to hand infant to another in the room if she is planning on sleeping.   Mom reports she was not active with WIC during her pregnancy, she is planning to call WIC after d/c.   Enc mom to call out  when infant awakens to feed for assistance.    Maternal Data Formula Feeding for Exclusion: Yes Reason for exclusion: Mother's choice to formula and breast feed on admission Has patient been taught Hand Expression?: Yes Does the patient have breastfeeding experience prior to this delivery?: Yes  Feeding Feeding Type: Breast Fed  LATCH Score Latch: Too sleepy or reluctant, no latch achieved, no sucking elicited.  Audible Swallowing: None  Type of Nipple: Everted at rest and after stimulation(semi flat)  Comfort (Breast/Nipple): Soft / non-tender  Hold (Positioning): Assistance needed to correctly position infant at breast and maintain latch.  LATCH Score: 5  Interventions Interventions: Breast feeding basics reviewed;Support pillows;Position options;Breast massage;Breast compression;Hand express  Lactation Tools Discussed/Used Tools: (spoon) WIC Program: No(Plans to apply, has had previously and has phone # to call)   Consult Status Consult Status: Follow-up Date: 03/21/18 Follow-up type: In-patient    Silas FloodSharon S Hice 03/20/2018, 12:31 PM

## 2018-03-20 NOTE — Transfer of Care (Signed)
Immediate Anesthesia Transfer of Care Note  Patient: Wendy Aguilar  Procedure(s) Performed: CESAREAN SECTION (N/A )  Patient Location: PACU  Anesthesia Type:General  Level of Consciousness: awake, alert , drowsy and patient cooperative  Airway & Oxygen Therapy: Patient Spontanous Breathing and Patient connected to nasal cannula oxygen  Post-op Assessment: Report given to RN and Post -op Vital signs reviewed and stable  Post vital signs: Reviewed and stable  Last Vitals:  Vitals Value Taken Time  BP 134/68 03/20/2018  1:30 AM  Temp    Pulse 115 03/20/2018  1:32 AM  Resp 17 03/20/2018  1:32 AM  SpO2 100 % 03/20/2018  1:32 AM  Vitals shown include unvalidated device data.  Last Pain:  Vitals:   03/19/18 2200  TempSrc: Oral  PainSc:          Complications: No apparent anesthesia complications

## 2018-03-20 NOTE — Anesthesia Postprocedure Evaluation (Signed)
Anesthesia Post Note  Patient: Wendy Aguilar  Procedure(s) Performed: CESAREAN SECTION (N/A )     Patient location during evaluation: Mother Baby Anesthesia Type: Epidural and General Level of consciousness: awake and alert Pain management: pain level controlled Vital Signs Assessment: post-procedure vital signs reviewed and stable Respiratory status: spontaneous breathing, nonlabored ventilation and respiratory function stable Cardiovascular status: stable Postop Assessment: no headache, no backache, epidural receding, no apparent nausea or vomiting, patient able to bend at knees, adequate PO intake and able to ambulate Anesthetic complications: no    Last Vitals:  Vitals:   03/20/18 0515 03/20/18 0620  BP: 128/69 124/74  Pulse: 79 81  Resp: 18 16  Temp: 37 C 36.8 C  SpO2: 98% 100%    Last Pain:  Vitals:   03/20/18 0620  TempSrc: Oral  PainSc: 0-No pain   Pain Goal:                 Land O'LakesMalinova,Lakeasha Petion Hristova

## 2018-03-20 NOTE — Anesthesia Postprocedure Evaluation (Signed)
Anesthesia Post Note  Patient: Wendy Aguilar  Procedure(s) Performed: CESAREAN SECTION (N/A )     Patient location during evaluation: PACU Anesthesia Type: General Level of consciousness: awake and alert Pain management: pain level controlled Vital Signs Assessment: post-procedure vital signs reviewed and stable Respiratory status: spontaneous breathing, nonlabored ventilation, respiratory function stable and patient connected to nasal cannula oxygen Cardiovascular status: blood pressure returned to baseline and stable Postop Assessment: no apparent nausea or vomiting Anesthetic complications: no    Last Vitals:  Vitals:   03/19/18 2302 03/20/18 0130  BP: 125/60 134/68  Pulse: (!) 101 (!) 107  Resp: 16 15  Temp:    SpO2:  100%    Last Pain:  Vitals:   03/20/18 0130  TempSrc:   PainSc: (P) 0-No pain   Pain Goal:                 [name]

## 2018-03-20 NOTE — Anesthesia Procedure Notes (Signed)
Procedure Name: Intubation Date/Time: 03/20/2018 12:17 AM Performed by: Sandrea Matte, CRNA Pre-anesthesia Checklist: Patient identified, Emergency Drugs available, Suction available and Patient being monitored Patient Re-evaluated:Patient Re-evaluated prior to induction Oxygen Delivery Method: Circle system utilized Preoxygenation: Pre-oxygenation with 100% oxygen Induction Type: IV induction, Rapid sequence and Cricoid Pressure applied Laryngoscope Size: Mac and 3 Grade View: Grade I Tube type: Oral Tube size: 7.0 mm Number of attempts: 1 Airway Equipment and Method: Stylet Placement Confirmation: ETT inserted through vocal cords under direct vision,  positive ETCO2 and breath sounds checked- equal and bilateral Secured at: 22 cm Tube secured with: Tape Dental Injury: Teeth and Oropharynx as per pre-operative assessment

## 2018-03-20 NOTE — Op Note (Signed)
Wendy Aguilar PROCEDURE DATE: 03/20/2018  PREOPERATIVE DIAGNOSES: Intrauterine pregnancy at 5068w2d weeks gestation; non-reassuring fetal status, chorioamnionitis  POSTOPERATIVE DIAGNOSES: The same  PROCEDURE: Repeat Low Transverse Cesarean Section  SURGEON:  Baldemar LenisK. Meryl Ellanie Oppedisano, MD  ASSISTANT:  Rhett BannisterLaurel Wallace, DO  ANESTHESIOLOGY TEAM: Anesthesiologist: Mal AmabileFoster, Michael, MD; Bethena Midgetddono, Ernest, MD CRNA: Trellis PaganiniBrewer, Suzanne N, CRNA  INDICATIONS: Wendy Aguilar is a 17 y.o. G2P1001 at 2868w2d here for cesarean section secondary to the indications listed under preoperative diagnoses; please see preoperative note for further details.  The risks of cesarean section were discussed with the patient including but were not limited to: bleeding which may require transfusion or reoperation; infection which may require antibiotics; injury to bowel, bladder, ureters or other surrounding organs; injury to the fetus; need for additional procedures including hysterectomy in the event of a life-threatening hemorrhage; placental abnormalities wth subsequent pregnancies, incisional problems, thromboembolic phenomenon and other postoperative/anesthesia complications.  She consents to blood transfusion in the event of an emergency. The patient verbalized understanding of the plan, giving informed written consent for the procedure.    FINDINGS:  Viable female infant in cephalic presentation.  Apgars 1/8/9.  Clear amniotic fluid.  Intact placenta, three vessel cord.  Normal uterus, fallopian tubes and ovaries bilaterally.  ANESTHESIA: General  INTRAVENOUS FLUIDS: 1600 ml   ESTIMATED BLOOD LOSS: 691 ml URINE OUTPUT:  150 ml SPECIMENS: Placenta sent to pathology COMPLICATIONS: None immediate  PROCEDURE IN DETAIL:  The patient preoperatively received intravenous antibiotics and had sequential compression devices applied to her lower extremities.  She was then taken to the operating room where the epidural anesthesia was dosed up  to surgical level but patient could not be made comfortable and decision made to proceed with general anesthesia. She was placed under general anesthesia without complication and intubated. She was then placed in a dorsal supine position with a leftward tilt, and prepped and draped in a sterile manner.  A foley catheter was placed into her bladder with sterile technique and attached to constant gravity.  After a timeout was performed, a Pfannenstiel skin incision was made with scalpel over her preexisting scar and scar was excised. Incision carried through to the underlying layer of fascia. The fascia was incised in the midline, and this incision was extended bilaterally using the Mayo scissors.  Kocher clamps were applied to the superior aspect of the fascial incision and the underlying rectus muscles were dissected off bluntly.  A similar process was carried out on the inferior aspect of the fascial incision. The rectus muscles were separated in the midline bluntly and the peritoneum was entered bluntly. The peritoneal incision was carefully extended laterally and caudad with good visualization of the bladder. The uterus appeared normal, small amount of bladder adhered to lower uterine segment on left and this was gently taken down using metzenbaum scissors, and a bladder flap was formed. An alexis O retractor was placed into the incision. The bladder blade was inserted. Attention was turned to the lower uterine segment where a low transverse hysterotomy was made with a scalpel and extended bilaterally bluntly.  The infant was delivered from ROP position, nose and mouth were bulb suctioned, and the cord clamped and cut after 30 seconds due to poor tone. The infant was then handed over to the waiting neonatology team. Uterine massage was then performed, and the placenta delivered intact with a three-vessel cord. The uterus was then cleared of clots and debris.  The hysterotomy was closed with 0 Vicryl in a  running  locked fashion, and an imbricating layer was also placed with 0 Vicryl. The fallopian tubes and ovaries were visualized bilaterally and normal appearing.   The pelvis was cleared of all clot and debris. Hemostasis was again confirmed on all surfaces. The peritoneum was re-approximated using 2-0 Vicryl suture. The fascia was then closed using 0 Vicryl in a running fashion.  The subcutaneous layer was irrigated, then reapproximated with 2-0 plain gut, and 30 ml of 0.5% Marcaine was injected subcutaneously around the incision.  The skin was closed with a 4-0 Monocryl subcuticular stitch. The patient tolerated the procedure well. She was extubated in the OR without difficulty. Sponge, lap, instrument and needle counts were correct x 3.  She was taken to the recovery room in stable condition.    Baldemar LenisK. Meryl Amos Gaber, M.D. Attending Obstetrician & Gynecologist, [facility], [facility]

## 2018-03-21 NOTE — Lactation Note (Signed)
This note was copied from a baby's chart. Lactation Consultation Note  Patient Name: Boy SwazilandJordan Felker NWGNF'AToday's Date: 03/21/2018 Reason for consult: Follow-up assessment Mom chooses to formula feed baby.  Symphony pump set up but mom is not pumping.  Discussed milk coming to volume.  Instructed mom to pump every 3 hours x 15 minutes if she desires to establish a milk supply.  Mom denies questions about pump.  Encouraged to call with concerns.  Maternal Data    Feeding Feeding Type: Formula Nipple Type: Slow - flow  LATCH Score                   Interventions    Lactation Tools Discussed/Used     Consult Status Consult Status: PRN    Huston FoleyMOULDEN, Zulay Corrie S 03/21/2018, 11:29 AM

## 2018-03-21 NOTE — Progress Notes (Signed)
POSTPARTUM PROGRESS NOTE  POD #1  Subjective:  Wendy Aguilar is a 17 y.o. [id number] s/p rLTCS at 533w2d for NSFHT, failure to progress.  She reports she doing well. No acute events overnight.  She denies any problems with ambulating, voiding or po intake. Denies nausea or vomiting. She has not passed flatus. Pain is well controlled.  Lochia is appropriate.  Objective: Blood pressure 102/60, pulse 72, temperature 98 F (36.7 C), temperature source Oral, resp. rate 16, height 5\' 9"  (1.753 m), weight 98.9 kg, last menstrual period 06/04/2017, SpO2 100 %, unknown if currently breastfeeding.  Physical Exam:  General: alert, cooperative and no distress Chest: no respiratory distress Heart:regular rate, distal pulses intact Abdomen: soft, nontender,  Uterine Fundus: firm, appropriately tender DVT Evaluation: No calf swelling or tenderness Extremities: No LE edema Skin: warm, dry; incision clean/dry/intact w/ pressure dressing in place  Recent Labs    03/19/18 0202 03/20/18 0514  HGB 13.7 12.2  HCT 40.8 35.8*    Assessment/Plan: Wendy Aguilar is a 17 y.o. [id number] s/p rLTCS at 5233w2d for NRFHT, failure to progress. Labor complicated by Chorio.   POD#1 - Doing welll; pain well controlled. H/H appropriate  Routine postpartum care  OOB, ambulated  Lovenox for VTE prophylaxis Contraception: Declines inpatient Nexplanon. Wishes to use pills or patch.  Feeding: breast   Dispo: Plan for discharge tomorrow.   LOS: 2 days   Marcy Sirenatherine Joas Motton, D.O. OB Fellow  03/21/2018, 7:14 AM

## 2018-03-22 MED ORDER — IBUPROFEN 600 MG PO TABS: 600.0000 mg | ORAL_TABLET | Freq: Four times a day (QID) | ORAL | 0 refills | Status: DC

## 2018-03-22 MED ORDER — OXYCODONE HCL 5 MG PO TABS: 5.0000 mg | ORAL_TABLET | ORAL | 0 refills | Status: DC | PRN

## 2018-03-22 MED ORDER — SENNOSIDES-DOCUSATE SODIUM 8.6-50 MG PO TABS: 2.0000 | ORAL_TABLET | ORAL | 0 refills | Status: DC

## 2018-03-22 NOTE — Discharge Summary (Signed)
OB Discharge Summary     Patient Name: Wendy Aguilar DOB: 11/16/2000 MRN: [id number]  Date of admission: 03/19/2018 Delivering MD:    Milbert CoulterHairston, PendingBaby [[id number]][[id number]]      Hallum, Boy Wendy [[id number]][[id number]]  Leroy LibmanAVIS, KELLY M   Date of discharge: 03/22/2018  Admitting diagnosis: 1341 WK INDUCTION for postdates Intrauterine pregnancy: 870w2d     Secondary diagnosis:  Active Problems:   'light-for-dates' infant with signs of fetal malnutrition  Additional problems:  Chorio complicating labor      Discharge diagnosis: Term Pregnancy Delivered                                                                                                Post partum procedures:None  Augmentation: AROM, Pitocin and Foley Balloon  Complications: Intrauterine Inflammation or infection (Chorioamniotis)  Hospital course:  Induction of Labor With Cesarean Section  17 y.o. yo G2P2002 at 4670w2d was admitted to the hospital 03/19/2018 for induction of labor. Patient had a labor course significant for chorioamniotis. The patient went for cesarean section due to Arrest of Dilation and Non-Reassuring FHR, and delivered a Viable infant,   Milbert CoulterHairston, PendingBaby [[id number]][[id number]]    Sanford, Boy Wendy [[id number]][[id number]]  03/20/2018  Membrane Rupture Time/Date:    Milbert CoulterHairston, PendingBaby [[id number]][[id number]]      Jenelle MagesHairston, Boy Wendy [[id number]][[id number]]  3:08 AM ,   Milbert CoulterHairston, PendingBaby [[id number]][[id number]]      Jenelle MagesHairston, Boy Wendy [[id number]][[id number]]  03/19/2018   Details of operation can be found in separate operative Note.  Patient had an uncomplicated postpartum course. She is ambulating, tolerating a regular diet, passing flatus, and urinating well.  Patient is discharged home in stable condition on 03/22/18.                                    Physical exam  Vitals:   03/21/18 0600 03/21/18 1435 03/21/18 2110 03/22/18 0500  BP: 102/60 (!) 105/54 (!) 109/56 (!) 105/59  Pulse: 72 88 68 69  Resp: 16 20 18 18   Temp: 98 F (36.7 C) 98.3 F  (36.8 C) 98.2 F (36.8 C) 98.6 F (37 C)  TempSrc: Oral Oral Oral Oral  SpO2:  100% 100%   Weight:      Height:       General: alert and cooperative Lochia: appropriate Uterine Fundus: firm Incision: Dressing is clean, dry, and intact DVT Evaluation: No evidence of DVT seen on physical exam. Labs: Lab Results  Component Value Date   WBC 29.0 (H) 03/20/2018   HGB 12.2 03/20/2018   HCT 35.8 (L) 03/20/2018   MCV 85.2 03/20/2018   PLT 158 03/20/2018   CMP Latest Ref Rng & Units 03/20/2018  Creatinine 0.44 - 1.00 mg/dL 2.370.63    Discharge instruction: per After Visit Summary and "Baby and Me Booklet".  After visit meds:  Allergies as of 03/22/2018   No Known Allergies     Medication List    TAKE these medications   ibuprofen 600 MG tablet Commonly known as:  ADVIL,MOTRIN Take 1  tablet (600 mg total) by mouth every 6 (six) hours.   oxyCODONE 5 MG immediate release tablet Commonly known as:  Oxy IR/ROXICODONE Take 1 tablet (5 mg total) by mouth every 4 (four) hours as needed (pain scale 4-7).   prenatal multivitamin Tabs tablet Take 1 tablet by mouth daily at 12 noon.   senna-docusate 8.6-50 MG tablet Commonly known as:  Senokot-S Take 2 tablets by mouth daily. Start taking on:  03/23/2018       Diet: routine diet  Activity: Advance as tolerated. Pelvic rest for 6 weeks.   Outpatient follow up:2 weeks for incision check, 4 weeks for PP visit  Follow up Appt: Future Appointments  Date Time Provider Department Center  04/07/2018 10:15 AM [facility] NURSE [facility]  05/03/2018  3:55 PM Conan Bowensavis, Kelly M, MD [facility]   Follow up Visit:No follow-ups on file.  Postpartum contraception: POP vs. Patch   Newborn Data:   Milbert CoulterHairston, PendingBaby [[id number]][[id number]]  Live born adult  Birth Weight:   APGAR: ,   Newborn Delivery   Birth date/time:   Delivery type:        Lavine, Boy Wendy [[id number]][[id number]]  Live born female  Birth Weight: 7 lb 5.8 oz (3340 g) APGAR: 1,  8  Newborn Delivery   Birth date/time:  03/20/2018 00:30:00 Delivery type:  C-Section, Low Transverse Trial of labor:  Yes C-section categorization:  Repeat     Baby Feeding: Bottle and Breast Disposition:home with mother   03/22/2018 De Hollingsheadatherine L Mychael Smock, DO

## 2018-03-22 NOTE — Discharge Instructions (Signed)

## 2018-03-22 NOTE — Progress Notes (Signed)
Subjective: Postpartum Day 2: Cesarean Delivery Patient reports no flatus   Objective: Vital signs in last 24 hours: Temp:  [98.2 F (36.8 C)-98.6 F (37 C)] 98.6 F (37 C) (08/21 0500) Pulse Rate:  [68-88] 69 (08/21 0500) Resp:  [18-20] 18 (08/21 0500) BP: (105-109)/(54-59) 105/59 (08/21 0500) SpO2:  [100 %] 100 % (08/20 2110)  Physical Exam:  General: alert, cooperative, appears stated age and no distress Lochia: appropriate Uterine Fundus: firm Incision: no significant drainage DVT Evaluation: No evidence of DVT seen on physical exam.  Recent Labs    03/20/18 0514  HGB 12.2  HCT 35.8*    Assessment/Plan: Status post Cesarean section. Doing well postoperatively.  Continue current care.  Wendy RollingScott Mandisa Persinger 03/22/2018, 9:16 AM

## 2018-03-23 LAB — RPR: RPR: NONREACTIVE

## 2018-04-07 ENCOUNTER — Ambulatory Visit (INDEPENDENT_AMBULATORY_CARE_PROVIDER_SITE_OTHER): Payer: BLUE CROSS/BLUE SHIELD | Admitting: General Practice

## 2018-04-07 VITALS — BP 133/74 | HR 89 | Ht 69.0 in | Wt 194.0 lb

## 2018-04-07 DIAGNOSIS — Z5189 Encounter for other specified aftercare: Secondary | ICD-10-CM

## 2018-04-07 IMAGING — US US MFM OB COMP +14 WKS
1 series · 14 of 28 positions shown · non-contrast
Comparison: none

[Series 1: us mfm ob comp +14 wks · 14 of 100 slices shown]
[im 4/100]
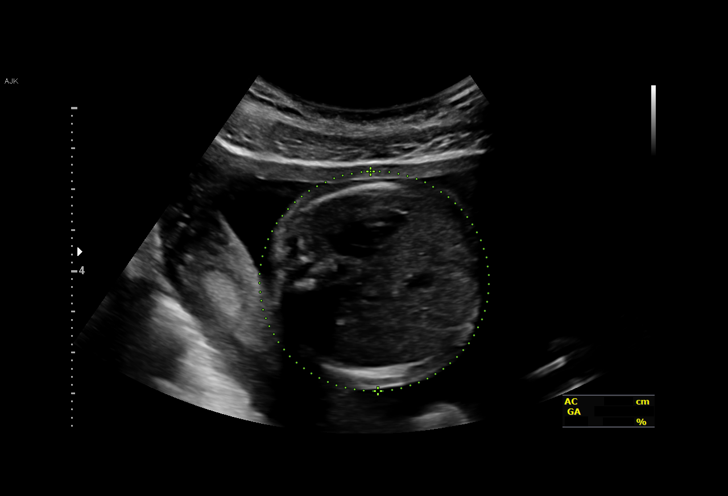
[im 12/100]
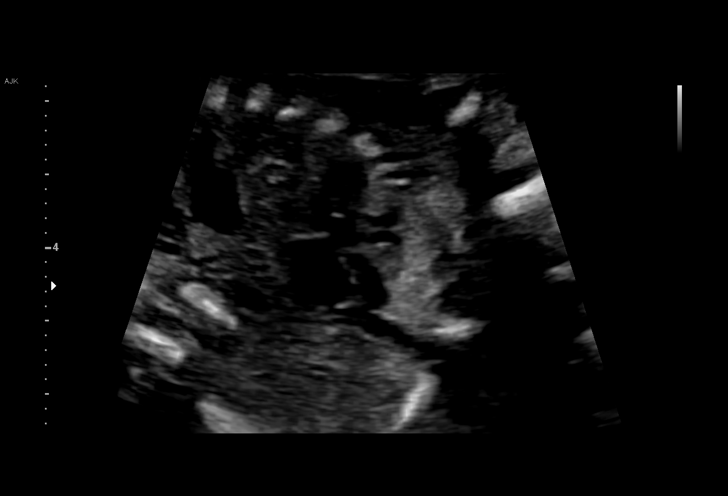
[im 19/100]
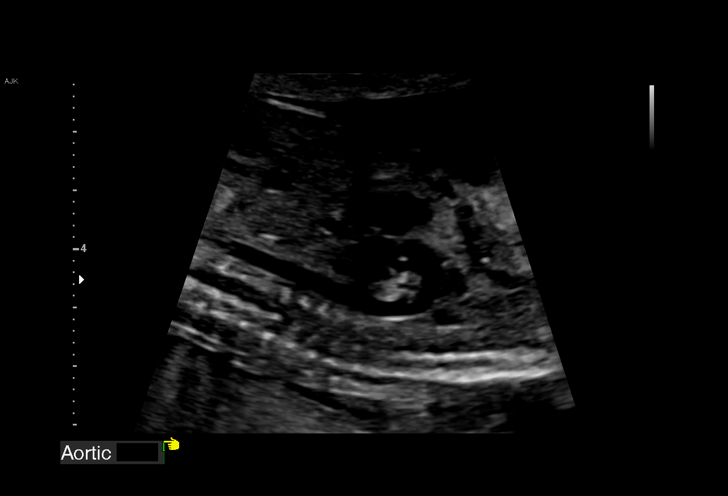
[im 26/100]
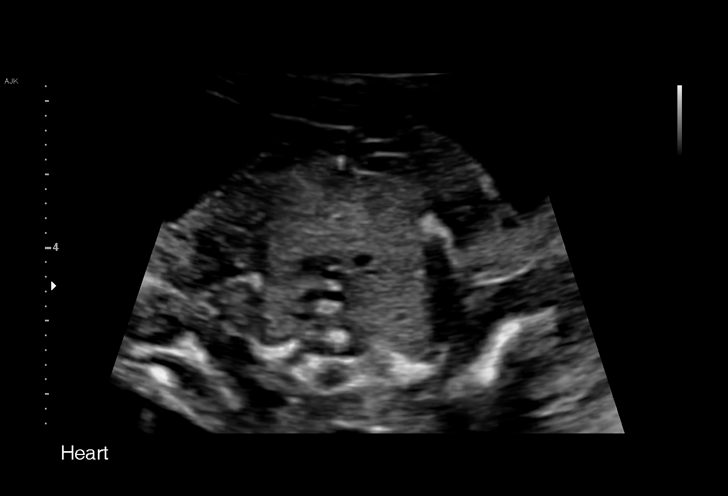
[im 34/100]
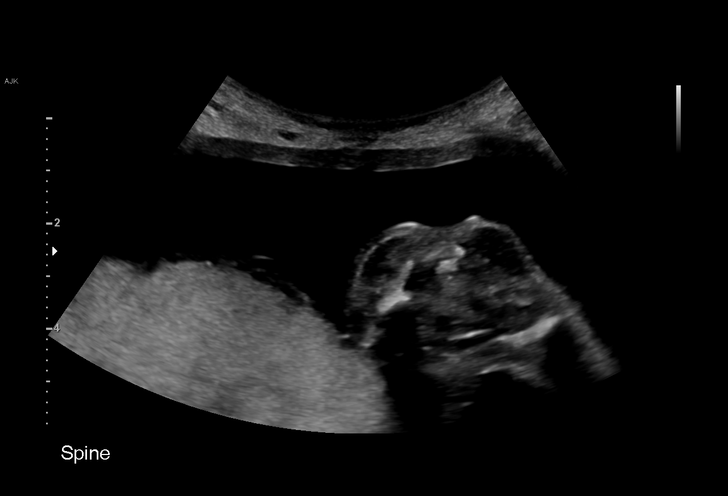
[im 41/100]
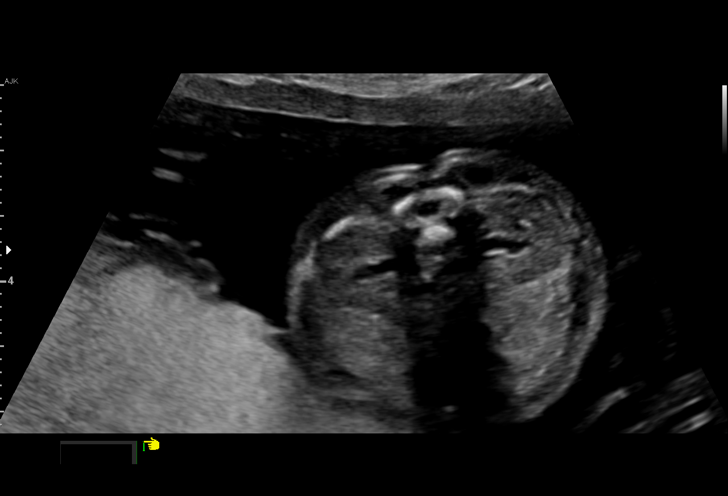
[im 48/100]
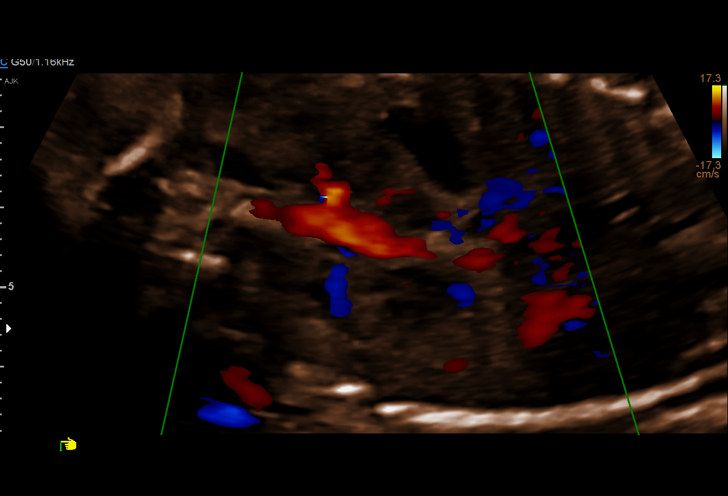
[im 56/100]
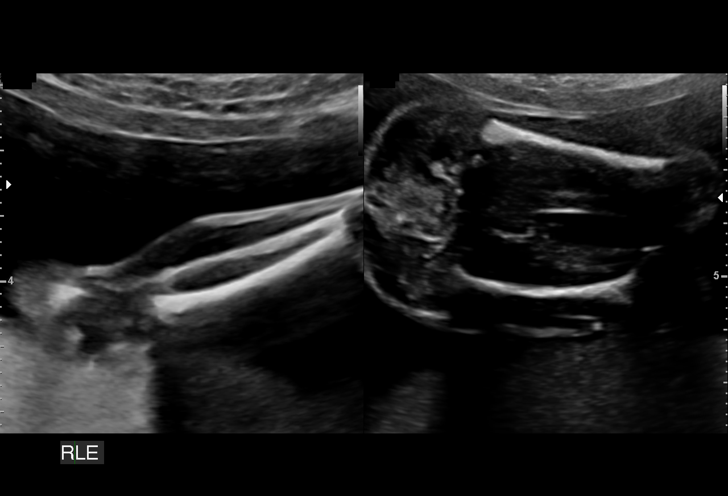
[im 63/100]
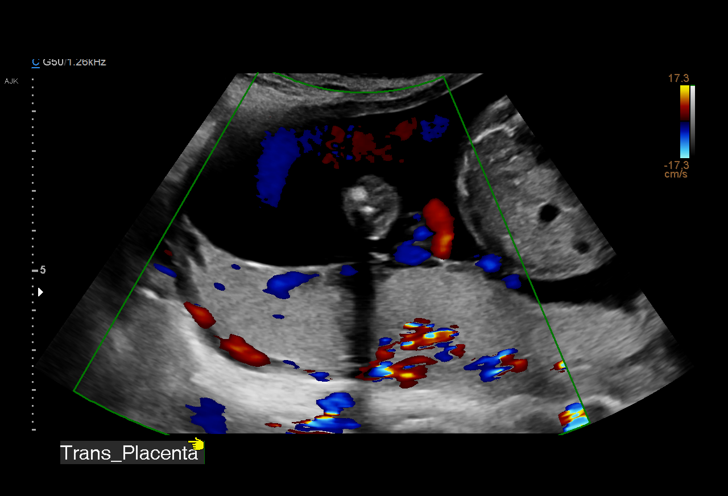
[im 70/100]
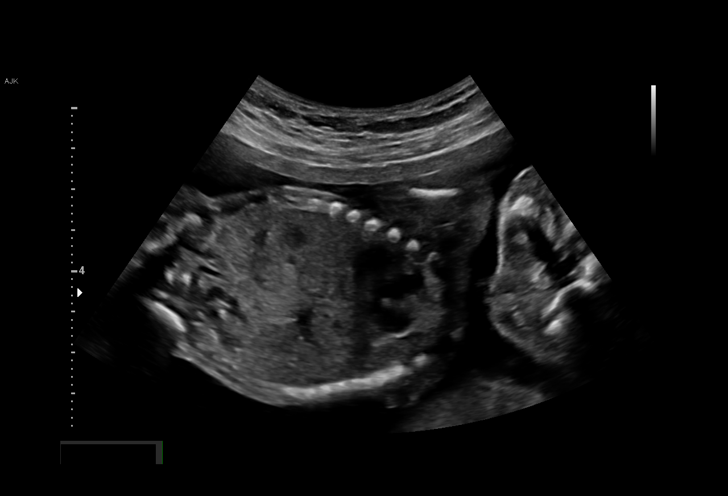
[im 78/100]
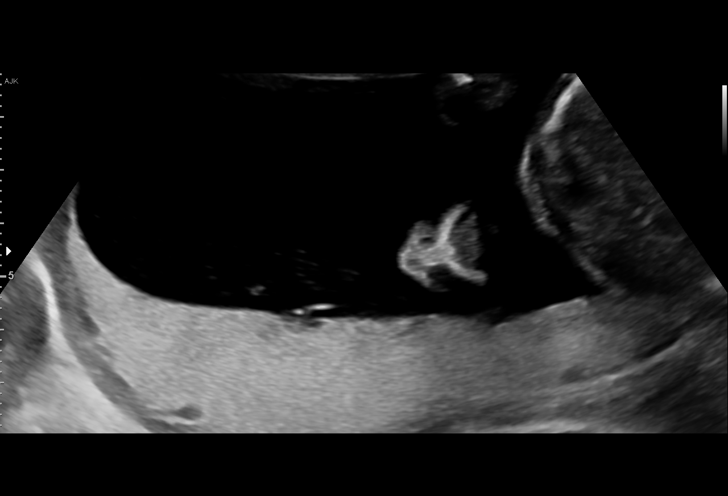
[im 85/100]
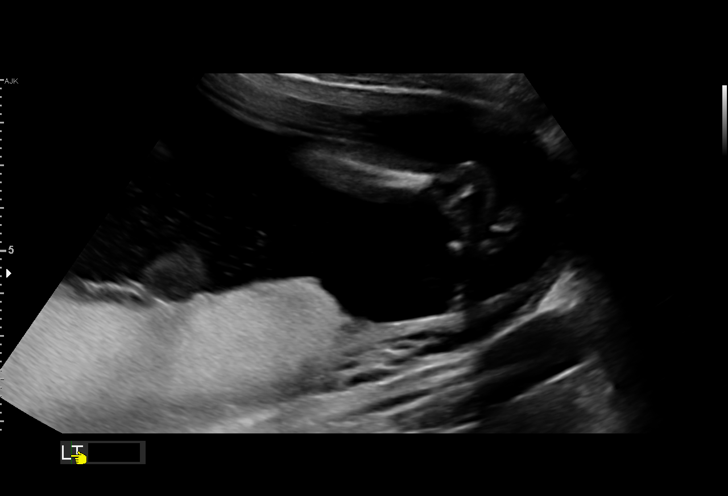
[im 92/100]
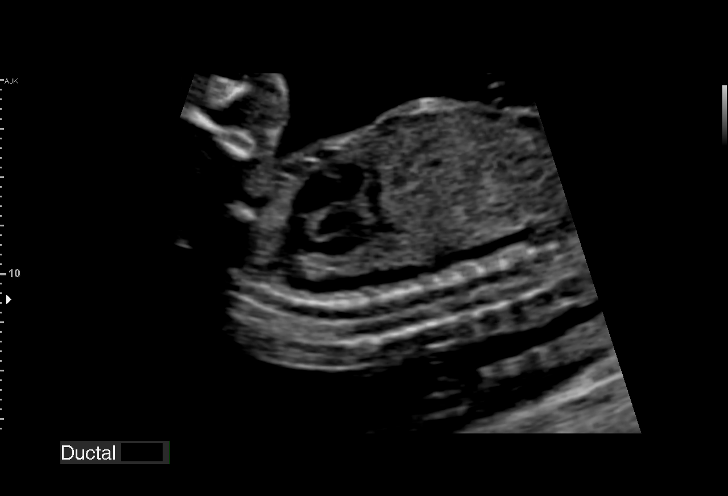
[im 100/100]
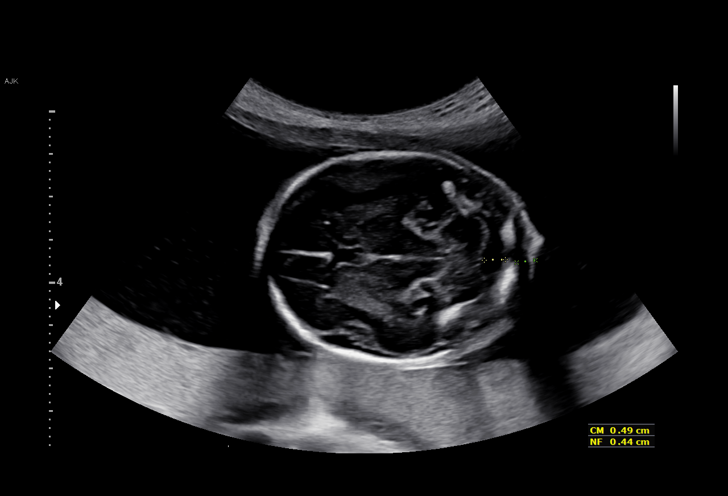

[14 of 28 positions shown; findings below may reference images not displayed]

Indications

20 weeks gestation of pregnancy
Previous cesarean delivery, antepartum
Encounter for fetal anatomic survey
Late prenatal care, second trimester
OB History

Blood Type:            Height:  5'8"   Weight (lb):  180       BMI:
Gravidity:    2         Term:   1        Prem:   0        SAB:   0
TOP:          0       Ectopic:  0        Living: 1
Fetal Evaluation

Num Of Fetuses:     1
Cardiac Activity:   Observed
Presentation:       Variable
Placenta:           Posterior, above cervical os
P. Cord Insertion:  Visualized, central

Amniotic Fluid
AFI FV:      Subjectively within normal limits

Largest Pocket(cm)
5.28
Biometry

BPD:      50.2  mm     G. Age:  21w 1d         79  %    CI:        77.36   %    70 - 86
FL/HC:      19.5   %    16.8 -
HC:      180.7  mm     G. Age:  20w 3d         45  %    HC/AC:      1.05        1.09 -
AC:      172.3  mm     G. Age:  22w 1d         91  %    FL/BPD:     70.1   %
FL:       35.2  mm     G. Age:  21w 1d         66  %    FL/AC:      20.4   %    20 - 24
CER:      20.8  mm     G. Age:  19w 6d         36  %
NFT:       4.4  mm
CM:        4.9  mm

Est. FW:     433  gm    0 lb 15 oz      62  %
Gestational Age

LMP:           20w 3d        Date:  06/04/17                 EDD:   03/11/18
U/S Today:     21w 2d                                        EDD:   03/05/18
Best:          20w 3d     Det. By:  LMP  (06/04/17)          EDD:   03/11/18
Anatomy

Cranium:               Appears normal         Aortic Arch:            Appears normal
Cavum:                 Appears normal         Ductal Arch:            Appears normal
Ventricles:            Appears normal         Diaphragm:              Appears normal
Choroid Plexus:        Appears normal         Stomach:                Appears normal, left
sided
Cerebellum:            Appears normal         Abdomen:                Appears normal
Posterior Fossa:       Appears normal         Abdominal Wall:         Appears nml (cord
insert, abd wall)
Nuchal Fold:           Appears normal         Cord Vessels:           Appears normal (3
vessel cord)
Face:                  Appears normal         Kidneys:                Appear normal
(orbits and profile)
Lips:                  Appears normal         Bladder:                Appears normal
Thoracic:              Appears normal         Spine:                  Appears normal
Heart:                 Appears normal         Upper Extremities:      Appears normal
(4CH, axis, and
situs)
RVOT:                  Appears normal         Lower Extremities:      Appears normal
LVOT:                  Appears normal

Other:  Fetus appears to be a male. Heels and 5th digit visualized. Nasal
bone visualized. Technically difficult due to fetal position.
Cervix Uterus Adnexa

Cervix
Length:           4.28  cm.
Normal appearance by transabdominal scan.

Uterus
No abnormality visualized.

Left Ovary
Not visualized.

Right Ovary
Not visualized.

Adnexa:       No abnormality visualized.
Impression

Single living intrauterine pregnancy at 20w 3d.
Variable presentation.
Placenta Posterior, above cervical os.
Appropriate fetal growth.
Normal amniotic fluid volume.
The fetal anatomic survey is complete.
Normal fetal anatomy.
No fetal anomalies or soft markers of aneuploidy seen.
The adnexa appear normal bilaterally without masses.
The cervix measures 4.28cm on transabdominal imaging
without funneling.
Recommendations

Follow-up ultrasounds as clinically indicated.

## 2018-04-07 NOTE — Progress Notes (Signed)
Patient presents to office today for wound check following repeat c-section on 8/19. Incision is clean, dry & intact. Steri Strips are in place. Reviewed wound care and signs & symptoms of infection with patient. Patient will return for pp visit on 10/2.

## 2018-04-10 ENCOUNTER — Encounter: Payer: Self-pay | Admitting: *Deleted

## 2018-05-03 ENCOUNTER — Ambulatory Visit: Payer: BLUE CROSS/BLUE SHIELD | Admitting: Obstetrics and Gynecology

## 2018-05-09 ENCOUNTER — Telehealth: Payer: Self-pay | Admitting: Licensed Clinical Social Worker

## 2018-05-09 NOTE — Telephone Encounter (Signed)
Left message regarding no show appt

## 2019-05-22 ENCOUNTER — Ambulatory Visit (HOSPITAL_COMMUNITY)
Admission: EM | Admit: 2019-05-22 | Discharge: 2019-05-22 | Disposition: A | Discharge status: Home / Self Care | Payer: BC Managed Care – PPO | Attending: Family Medicine | Admitting: Family Medicine

## 2019-05-22 ENCOUNTER — Other Ambulatory Visit: Payer: Self-pay

## 2019-05-22 ENCOUNTER — Encounter (HOSPITAL_COMMUNITY): Payer: Self-pay | Admitting: Emergency Medicine

## 2019-05-22 ENCOUNTER — Ambulatory Visit (INDEPENDENT_AMBULATORY_CARE_PROVIDER_SITE_OTHER): Payer: BC Managed Care – PPO

## 2019-05-22 DIAGNOSIS — M79642 Pain in left hand: Secondary | ICD-10-CM

## 2019-05-22 MED ORDER — DICLOFENAC SODIUM 75 MG PO TBEC: 75.0000 mg | DELAYED_RELEASE_TABLET | Freq: Two times a day (BID) | ORAL | 0 refills | Status: DC
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
## 2019-07-22 ENCOUNTER — Ambulatory Visit
Admission: EM | Admit: 2019-07-22 | Discharge: 2019-07-22 | Disposition: A | Discharge status: Home / Self Care | Payer: BC Managed Care – PPO | Attending: Physician Assistant | Admitting: Physician Assistant

## 2019-07-22 ENCOUNTER — Other Ambulatory Visit: Payer: Self-pay

## 2019-07-22 DIAGNOSIS — J039 Acute tonsillitis, unspecified: Secondary | ICD-10-CM

## 2019-07-22 LAB — POCT RAPID STREP A (OFFICE): Rapid Strep A Screen: NEGATIVE

## 2019-07-22 MED ORDER — LIDOCAINE VISCOUS HCL 2 % MT SOLN: OROMUCOSAL | 0 refills | Status: DC

## 2019-07-22 MED ORDER — AMOXICILLIN 500 MG PO CAPS: 500.0000 mg | ORAL_CAPSULE | Freq: Two times a day (BID) | ORAL | 0 refills | Status: DC

## 2019-07-22 NOTE — ED Provider Notes (Signed)
[name]    CSN: [id number] Arrival date & time: 07/22/19  1259      History   Chief Complaint Chief Complaint  Patient presents with  . Sore Throat    HPI Wendy Aguilar is a 18 y.o. female.   18 year old female comes in for 3 day of sore throat.  Has had painful swallowing without trouble breathing, tripoding, drooling, trismus.  Denies rhinorrhea, nasal congestion, cough. Denies fever, chills, body aches. Denies abdominal pain, nausea, vomiting, diarrhea. Denies shortness of breath, loss of taste/smell.  Works at [employer] unit at [employer], negative Covid test yesterday.     Past Medical History:  Diagnosis Date  . Medical history non-contributory     Patient Active Problem List   Diagnosis Date Noted  . 'light-for-dates' infant with signs of fetal malnutrition 03/19/2018  . False labor 03/12/2018  . NST (non-stress test) reactive 03/12/2018  . Encounter for fetal anatomic survey   . Late prenatal care affecting pregnancy in second trimester   . Supervision of other normal pregnancy, antepartum 10/21/2017  . History of cesarean section 10/21/2017    Past Surgical History:  Procedure Laterality Date  . CESAREAN SECTION N/A 10/10/2016   Procedure: CESAREAN SECTION;  Surgeon: Woodroe Mode, MD;  Location[facility];  Service: Obstetrics;  Laterality: N/A;  . CESAREAN SECTION N/A 03/19/2018   Procedure: CESAREAN SECTION;  Surgeon: Sloan Leiter, MD;  Location: [place];  Service: Obstetrics;  Laterality: N/A;    OB History    Gravida  2   Para  2   Term  2   Preterm      AB      Living  2     SAB      TAB      Ectopic      Multiple  0   Live Births  2            Home Medications    Prior to Admission medications   Medication Sig Start Date End Date Taking? Authorizing Provider  amoxicillin (AMOXIL) 500 MG capsule Take 1 capsule (500 mg total) by mouth 2 (two) times daily. 07/22/19   Tasia Catchings, Tationna Fullard V, PA-C    lidocaine (XYLOCAINE) 2 % solution 5-15 mL gurgle as needed 07/22/19   Ok Edwards, PA-C    Family History Family History  Problem Relation Age of Onset  . Alcohol abuse Neg Hx   . Arthritis Neg Hx   . Asthma Neg Hx   . Birth defects Neg Hx   . COPD Neg Hx   . Depression Neg Hx   . Diabetes Neg Hx   . Drug abuse Neg Hx   . Early death Neg Hx   . Hearing loss Neg Hx   . Heart disease Neg Hx   . Hyperlipidemia Neg Hx   . Hypertension Neg Hx   . Kidney disease Neg Hx   . Learning disabilities Neg Hx   . Mental illness Neg Hx   . Mental retardation Neg Hx   . Miscarriages / Stillbirths Neg Hx   . Stroke Neg Hx   . Vision loss Neg Hx   . Varicose Veins Neg Hx     Social History Social History   Tobacco Use  . Smoking status: Never Smoker  . Smokeless tobacco: Never Used  Substance Use Topics  . Alcohol use: No  . Drug use: No     Allergies  Patient has no known allergies.   Review of Systems Review of Systems  Reason unable to perform ROS: See HPI as above.     Physical Exam Triage Vital Signs ED Triage Vitals  Enc Vitals Group     BP 07/22/19 1323 120/75     Pulse Rate 07/22/19 1323 70     Resp 07/22/19 1323 18     Temp 07/22/19 1323 98.9 F (37.2 C)     Temp Source 07/22/19 1323 Oral     SpO2 07/22/19 1323 98 %     Weight --      Height --      Head Circumference --      Peak Flow --      Pain Score 07/22/19 1324 4     Pain Loc --      Pain Edu? --      Excl. in [place]? --    No data found.  Updated Vital Signs BP 120/75 (BP Location: Left Arm)   Pulse 70   Temp 98.9 F (37.2 C) (Oral)   Resp 18   LMP 07/03/2019   SpO2 98%   Physical Exam Constitutional:      General: She is not in acute distress.    Appearance: Normal appearance. She is not ill-appearing, toxic-appearing or diaphoretic.  HENT:     Head: Normocephalic and atraumatic.     Right Ear: Tympanic membrane and ear canal normal. Tympanic membrane is not erythematous.     Left  Ear: Tympanic membrane and ear canal normal. Tympanic membrane is not erythematous.     Mouth/Throat:     Mouth: Mucous membranes are moist.     Pharynx: Oropharynx is clear. Uvula midline.     Tonsils: Tonsillar exudate present. 1+ on the right. 1+ on the left.  Cardiovascular:     Rate and Rhythm: Normal rate and regular rhythm.     Heart sounds: Normal heart sounds. No murmur. No friction rub. No gallop.   Pulmonary:     Effort: Pulmonary effort is normal. No accessory muscle usage, prolonged expiration, respiratory distress or retractions.     Comments: Lungs clear to auscultation without adventitious lung sounds. Musculoskeletal:     Cervical back: Normal range of motion and neck supple.  Neurological:     General: No focal deficit present.     Mental Status: She is alert and oriented to person, place, and time.      UC Treatments / Results  Labs (all labs ordered are listed, but only abnormal results are displayed) Labs Reviewed  POCT RAPID STREP A (OFFICE) - Normal  CULTURE, GROUP A STREP [facility])    EKG   Radiology No results found.  Procedures Procedures (including critical care time)  Medications Ordered in UC Medications - No data to display  Initial Impression / Assessment and Plan / UC Course  I have reviewed the triage vital signs and the nursing notes.  Pertinent labs & imaging results that were available during my care of the patient were reviewed by me and considered in my medical decision making (see chart for details).    Rapid strep negative.  However, given history and exam, will cover for tonsillitis with amoxicillin.  Other symptomatic treatment discussed.  Return precautions given.  Patient expresses understanding and agrees to plan.  Final Clinical Impressions(s) / UC Diagnoses   Final diagnoses:  Acute tonsillitis, unspecified etiology   ED Prescriptions    Medication Sig Dispense Auth. Provider  lidocaine (XYLOCAINE) 2 % solution 5-15  mL gurgle as needed 150 mL Luan Urbani V, PA-C   amoxicillin (AMOXIL) 500 MG capsule Take 1 capsule (500 mg total) by mouth 2 (two) times daily. 20 capsule Belinda Fisher, PA-C     PDMP not reviewed this encounter.   Belinda Fisher, PA-C 07/22/19 1401

## 2019-07-22 NOTE — ED Triage Notes (Signed)
Pt c/o sore throat x3day, had a negative COVID test yesterday

## 2019-07-22 NOTE — Discharge Instructions (Addendum)
Rapid strep negative. However, given your exam, will cover you empirically for bacterial infection with amoxicillin. As discussed, symptoms can still be due to viral illness/ drainage down your throat. This usually takes 7-10 days to resolve. Start lidocaine for sore throat, do not eat or drink for the next 40 mins after use as it can stunt your gag reflex. Monitor for any worsening of symptoms, swelling of the throat, trouble breathing, trouble swallowing, leaning forward to breath, drooling, go to the emergency department for further evaluation needed.  For sore throat/cough try using a honey-based tea. Use 3 teaspoons of honey with juice squeezed from half lemon. Place shaved pieces of ginger into 1/2-1 cup of water and warm over stove top. Then mix the ingredients and repeat every 4 hours as needed.

## 2019-07-25 LAB — CULTURE, GROUP A STREP (THRC)

## 2019-12-05 ENCOUNTER — Encounter (HOSPITAL_COMMUNITY): Payer: Self-pay

## 2019-12-05 ENCOUNTER — Other Ambulatory Visit: Payer: Self-pay

## 2019-12-05 ENCOUNTER — Ambulatory Visit (HOSPITAL_COMMUNITY)
Admission: EM | Admit: 2019-12-05 | Discharge: 2019-12-05 | Disposition: A | Discharge status: Home / Self Care | Payer: BC Managed Care – PPO | Attending: Family Medicine | Admitting: Family Medicine

## 2019-12-05 DIAGNOSIS — Z20822 Contact with and (suspected) exposure to covid-19: Secondary | ICD-10-CM | POA: Diagnosis not present

## 2019-12-05 DIAGNOSIS — J069 Acute upper respiratory infection, unspecified: Secondary | ICD-10-CM | POA: Insufficient documentation

## 2019-12-05 MED ORDER — HYDROCODONE-HOMATROPINE 5-1.5 MG/5ML PO SYRP: 5.0000 mL | ORAL_SOLUTION | Freq: Every evening | ORAL | 0 refills | Status: DC | PRN

## 2019-12-05 MED ORDER — BENZONATATE 200 MG PO CAPS: 200.0000 mg | ORAL_CAPSULE | Freq: Three times a day (TID) | ORAL | 0 refills | Status: AC | PRN

## 2019-12-05 NOTE — ED Provider Notes (Signed)
[facility]    CSN: [id number] Arrival date & time: 12/05/19  1528      History   Chief Complaint Chief Complaint  Patient presents with  . Nasal Congestion  . Cough    HPI Wendy Aguilar is a 19 y.o. female presenting today for evaluation of URI symptoms.  Patient notes over the past 3 days she has had cough, congestion, headache and body aches.  She reports she has felt feverish, but no known measured fevers.  Has bilateral frontal headache with sinus pressure.  Denies any close sick contacts.  Denies chest pain or shortness of breath.  Has been using ibuprofen and Tylenol as well as Mucinex DM without relief of symptoms.  Denies GI symptoms.  HPI  Past Medical History:  Diagnosis Date  . Medical history non-contributory     Patient Active Problem List   Diagnosis Date Noted  . 'light-for-dates' infant with signs of fetal malnutrition 03/19/2018  . False labor 03/12/2018  . NST (non-stress test) reactive 03/12/2018  . Encounter for fetal anatomic survey   . Late prenatal care affecting pregnancy in second trimester   . Supervision of other normal pregnancy, antepartum 10/21/2017  . History of cesarean section 10/21/2017    Past Surgical History:  Procedure Laterality Date  . CESAREAN SECTION N/A 10/10/2016   Procedure: CESAREAN SECTION;  Surgeon: Adam Phenix, MD;  Location: [facility];  Service: Obstetrics;  Laterality: N/A;  . CESAREAN SECTION N/A 03/19/2018   Procedure: CESAREAN SECTION;  Surgeon: Conan Bowens, MD;  Location: [facility];  Service: Obstetrics;  Laterality: N/A;    OB History    Gravida  2   Para  2   Term  2   Preterm      AB      Living  2     SAB      TAB      Ectopic      Multiple  0   Live Births  2            Home Medications    Prior to Admission medications   Medication Sig Start Date End Date Taking? Authorizing Provider  benzonatate (TESSALON) 200 MG capsule Take 1 capsule (200  mg total) by mouth 3 (three) times daily as needed for up to 7 days for cough (daytime). 12/05/19 12/12/19  Myriah Boggus C, PA-C  HYDROcodone-homatropine (HYCODAN) 5-1.5 MG/5ML syrup Take 5 mLs by mouth at bedtime as needed for cough. 12/05/19   Chanler Mendonca, Junius Creamer, PA-C    Family History Family History  Problem Relation Age of Onset  . Alcohol abuse Neg Hx   . Arthritis Neg Hx   . Asthma Neg Hx   . Birth defects Neg Hx   . COPD Neg Hx   . Depression Neg Hx   . Diabetes Neg Hx   . Drug abuse Neg Hx   . Early death Neg Hx   . Hearing loss Neg Hx   . Heart disease Neg Hx   . Hyperlipidemia Neg Hx   . Hypertension Neg Hx   . Kidney disease Neg Hx   . Learning disabilities Neg Hx   . Mental illness Neg Hx   . Mental retardation Neg Hx   . Miscarriages / Stillbirths Neg Hx   . Stroke Neg Hx   . Vision loss Neg Hx   . Varicose Veins Neg Hx     Social History Social History  Tobacco Use  . Smoking status: Never Smoker  . Smokeless tobacco: Never Used  Substance Use Topics  . Alcohol use: No  . Drug use: No     Allergies   Patient has no known allergies.   Review of Systems Review of Systems  Constitutional: Positive for appetite change and fatigue. Negative for activity change, chills and fever.  HENT: Positive for congestion. Negative for ear pain, rhinorrhea, sinus pressure, sore throat and trouble swallowing.   Eyes: Negative for discharge and redness.  Respiratory: Positive for cough. Negative for chest tightness and shortness of breath.   Cardiovascular: Negative for chest pain.  Gastrointestinal: Negative for abdominal pain, diarrhea, nausea and vomiting.  Musculoskeletal: Positive for back pain and myalgias.  Skin: Negative for rash.  Neurological: Positive for headaches. Negative for dizziness and light-headedness.     Physical Exam Triage Vital Signs ED Triage Vitals  Enc Vitals Group     BP 12/05/19 1635 125/85     Pulse Rate 12/05/19 1635 95     Resp  12/05/19 1635 16     Temp 12/05/19 1635 98.5 F (36.9 C)     Temp Source 12/05/19 1635 Oral     SpO2 12/05/19 1635 99 %     Weight --      Height --      Head Circumference --      Peak Flow --      Pain Score 12/05/19 1632 10     Pain Loc --      Pain Edu? --      Excl. in [place]? --    No data found.  Updated Vital Signs BP 125/85 (BP Location: Right Arm)   Pulse 95   Temp 98.5 F (36.9 C) (Oral)   Resp 16   LMP 11/27/2019   SpO2 99%   Visual Acuity Right Eye Distance:   Left Eye Distance:   Bilateral Distance:    Right Eye Near:   Left Eye Near:    Bilateral Near:     Physical Exam Vitals and nursing note reviewed.  Constitutional:      General: She is not in acute distress.    Appearance: She is well-developed.     Comments: Appears tired and uncomfortable, but no acute distress  HENT:     Head: Normocephalic and atraumatic.     Ears:     Comments: Bilateral ears without tenderness to palpation of external auricle, tragus and mastoid, EAC's without erythema or swelling, TM's with good bony landmarks and cone of light. Non erythematous.     Nose:     Comments: Nasal mucosa erythematous    Mouth/Throat:     Comments: Oral mucosa pink and moist, no tonsillar enlargement or exudate. Posterior pharynx patent and nonerythematous, no uvula deviation or swelling. Normal phonation. Eyes:     Conjunctiva/sclera: Conjunctivae normal.  Cardiovascular:     Rate and Rhythm: Normal rate and regular rhythm.     Heart sounds: No murmur.  Pulmonary:     Effort: Pulmonary effort is normal. No respiratory distress.     Breath sounds: Normal breath sounds.     Comments: Breathing comfortably at rest, CTABL, no wheezing, rales or other adventitious sounds auscultated Abdominal:     Palpations: Abdomen is soft.     Tenderness: There is no abdominal tenderness.  Musculoskeletal:     Cervical back: Neck supple.  Skin:    General: Skin is warm and dry.  Neurological:  Mental Status: She is alert.      UC Treatments / Results  Labs (all labs ordered are listed, but only abnormal results are displayed) Labs Reviewed  SARS CORONAVIRUS 2 (TAT 6-24 HRS)    EKG   Radiology No results found.  Procedures Procedures (including critical care time)  Medications Ordered in UC Medications - No data to display  Initial Impression / Assessment and Plan / UC Course  I have reviewed the triage vital signs and the nursing notes.  Pertinent labs & imaging results that were available during my care of the patient were reviewed by me and considered in my medical decision making (see chart for details).     2-3 days of URI symptoms, vital signs stable, exam unremarkable, most likely viral URI, recommending symptomatic and supportive care.  Rest and push fluids.  Provided Tessalon for daytime cough, Hycodan for nighttime cough to help with aches and rest.  Discussed risks associated with this and advised not to drive or work after taking.  Covid test pending.  Discussed strict return precautions. Patient verbalized understanding and is agreeable with plan.  Final Clinical Impressions(s) / UC Diagnoses   Final diagnoses:  Viral URI with cough     Discharge Instructions     Covid test pending, monitor my chart for results Continue Tylenol and ibuprofen as needed for headache, body aches and any fevers May continue Mucinex DM twice daily for congestion/cough Tessalon/benzonatate as needed for cough every 8 hours May use cough syrup/Hycodan at bedtime, will cause drowsiness, do not drive or work after taking Please continue to rest and drink plenty of fluids If symptoms not beginning to improve in another 4 to 5 days please follow-up or sooner if symptoms worsening, developing difficulty breathing   ED Prescriptions    Medication Sig Dispense Auth. Provider   benzonatate (TESSALON) 200 MG capsule Take 1 capsule (200 mg total) by mouth 3 (three) times  daily as needed for up to 7 days for cough (daytime). 28 capsule Stevie Charter C, PA-C   HYDROcodone-homatropine (HYCODAN) 5-1.5 MG/5ML syrup Take 5 mLs by mouth at bedtime as needed for cough. 75 mL Amily Depp, [place] C, PA-C     I have reviewed the PDMP during this encounter.   Lew Dawes, [place] 12/05/19 2053

## 2019-12-05 NOTE — ED Triage Notes (Signed)
Pt c/o nasal congestion, productive cough with yellow sputum, HA for 3 days. Onset today of body aches, SOB. Able to speak full sentences w/o difficulty. Pt has been taking tylenol/ibuprofen for aches/HA w/o improvement of symptoms and guaifenesin/dextromethorphan for cough.

## 2019-12-05 NOTE — Discharge Instructions (Signed)
Covid test pending, monitor my chart for results Continue Tylenol and ibuprofen as needed for headache, body aches and any fevers May continue Mucinex DM twice daily for congestion/cough Tessalon/benzonatate as needed for cough every 8 hours May use cough syrup/Hycodan at bedtime, will cause drowsiness, do not drive or work after taking Please continue to rest and drink plenty of fluids If symptoms not beginning to improve in another 4 to 5 days please follow-up or sooner if symptoms worsening, developing difficulty breathing

## 2019-12-06 LAB — SARS CORONAVIRUS 2 (TAT 6-24 HRS): SARS Coronavirus 2: NEGATIVE

## 2020-06-17 ENCOUNTER — Ambulatory Visit
Admission: EM | Admit: 2020-06-17 | Discharge: 2020-06-17 | Disposition: A | Discharge status: Home / Self Care | Payer: BC Managed Care – PPO | Attending: Family Medicine | Admitting: Family Medicine

## 2020-06-17 DIAGNOSIS — R8281 Pyuria: Secondary | ICD-10-CM | POA: Diagnosis not present

## 2020-06-17 LAB — POCT URINALYSIS DIP (MANUAL ENTRY)
Bilirubin, UA: NEGATIVE
Glucose, UA: NEGATIVE mg/dL
Ketones, POC UA: NEGATIVE mg/dL
Nitrite, UA: POSITIVE — AB
Protein Ur, POC: NEGATIVE mg/dL
Spec Grav, UA: 1.025 (ref 1.010–1.025)
Urobilinogen, UA: 1 E.U./dL
pH, UA: 7 (ref 5.0–8.0)

## 2020-06-17 LAB — POCT URINE PREGNANCY: Preg Test, Ur: NEGATIVE

## 2020-06-17 MED ORDER — CEPHALEXIN 500 MG PO CAPS: 500.0000 mg | ORAL_CAPSULE | Freq: Three times a day (TID) | ORAL | 0 refills | Status: DC

## 2020-06-17 NOTE — Discharge Instructions (Addendum)
Other tests will be ready tomorrow.

## 2020-06-17 NOTE — ED Triage Notes (Signed)
Pt c/o burning on urination with urgency and frequency x2 day. Pt requesting STD check. Denies vaginal discharge.

## 2020-06-17 NOTE — ED Provider Notes (Signed)
[name]    CSN: [id number] Arrival date & time: 06/17/20  1345      History   Chief Complaint Chief Complaint  Patient presents with  . Urinary Tract Infection    HPI Wendy Aguilar is a 19 y.o. female.   Established [facility] patient  Pt c/o burning on urination with urgency and frequency x2 day. Pt requesting STD check. Denies vaginal discharge.   LMP was last week and just ended.  Some suprapubic cramping after voiding.  No other abd or back pain.  No fever.     Past Medical History:  Diagnosis Date  . Medical history non-contributory     Patient Active Problem List   Diagnosis Date Noted  . 'light-for-dates' infant with signs of fetal malnutrition 03/19/2018  . False labor 03/12/2018  . NST (non-stress test) reactive 03/12/2018  . Encounter for fetal anatomic survey   . Late prenatal care affecting pregnancy in second trimester   . Supervision of other normal pregnancy, antepartum 10/21/2017  . History of cesarean section 10/21/2017    Past Surgical History:  Procedure Laterality Date  . CESAREAN SECTION N/A 10/10/2016   Procedure: CESAREAN SECTION;  Surgeon: Adam Phenix, MD;  Location: [facility] BIRTHING SUITES;  Service: Obstetrics;  Laterality: N/A;  . CESAREAN SECTION N/A 03/19/2018   Procedure: CESAREAN SECTION;  Surgeon: Conan Bowens, MD;  Location: [facility] BIRTHING SUITES;  Service: Obstetrics;  Laterality: N/A;    OB History    Gravida  2   Para  2   Term  2   Preterm      AB      Living  2     SAB      TAB      Ectopic      Multiple  0   Live Births  2            Home Medications    Prior to Admission medications   Medication Sig Start Date End Date Taking? Authorizing Provider  cephALEXin (KEFLEX) 500 MG capsule Take 1 capsule (500 mg total) by mouth in the morning, at noon, and at bedtime. 06/17/20   Elvina Sidle, MD    Family History Family History  Problem Relation Age of Onset  . Alcohol abuse  Neg Hx   . Arthritis Neg Hx   . Asthma Neg Hx   . Birth defects Neg Hx   . COPD Neg Hx   . Depression Neg Hx   . Diabetes Neg Hx   . Drug abuse Neg Hx   . Early death Neg Hx   . Hearing loss Neg Hx   . Heart disease Neg Hx   . Hyperlipidemia Neg Hx   . Hypertension Neg Hx   . Kidney disease Neg Hx   . Learning disabilities Neg Hx   . Mental illness Neg Hx   . Mental retardation Neg Hx   . Miscarriages / Stillbirths Neg Hx   . Stroke Neg Hx   . Vision loss Neg Hx   . Varicose Veins Neg Hx     Social History Social History   Tobacco Use  . Smoking status: Never Smoker  . Smokeless tobacco: Never Used  Vaping Use  . Vaping Use: Never used  Substance Use Topics  . Alcohol use: No  . Drug use: No     Allergies   Patient has no known allergies.   Review of Systems Review of Systems   Physical Exam  Triage Vital Signs ED Triage Vitals  Enc Vitals Group     BP 06/17/20 1436 126/80     Pulse Rate 06/17/20 1436 63     Resp 06/17/20 1436 20     Temp 06/17/20 1436 98.1 F (36.7 C)     Temp Source 06/17/20 1436 Oral     SpO2 06/17/20 1436 99 %     Weight --      Height --      Head Circumference --      Peak Flow --      Pain Score 06/17/20 1443 0     Pain Loc --      Pain Edu? --      Excl. in GC? --    No data found.  Updated Vital Signs BP 126/80 (BP Location: Left Arm)   Pulse 63   Temp 98.1 F (36.7 C) (Oral)   Resp 20   LMP 06/07/2020   SpO2 99%   Breastfeeding No    Physical Exam Vitals and nursing note reviewed.  Constitutional:      General: She is not in acute distress.    Appearance: Normal appearance. She is normal weight. She is not ill-appearing.  Eyes:     Conjunctiva/sclera: Conjunctivae normal.  Pulmonary:     Effort: Pulmonary effort is normal.  Musculoskeletal:        General: Normal range of motion.     Cervical back: Normal range of motion and neck supple.  Skin:    General: Skin is warm.  Neurological:     General:  No focal deficit present.     Mental Status: She is alert.  Psychiatric:        Mood and Affect: Mood normal.        Behavior: Behavior normal.        Thought Content: Thought content normal.        Judgment: Judgment normal.      UC Treatments / Results  Labs (all labs ordered are listed, but only abnormal results are displayed) Labs Reviewed  POCT URINALYSIS DIP (MANUAL ENTRY) - Abnormal; Notable for the following components:      Result Value   Clarity, UA hazy (*)    Blood, UA small (*)    Nitrite, UA Positive (*)    Leukocytes, UA Large (3+) (*)    All other components within normal limits  POCT URINE PREGNANCY    EKG   Radiology No results found.  Procedures Procedures (including critical care time)  Medications Ordered in UC Medications - No data to display  Initial Impression / Assessment and Plan / UC Course  I have reviewed the triage vital signs and the nursing notes.  Pertinent labs & imaging results that were available during my care of the patient were reviewed by me and considered in my medical decision making (see chart for details).     Final Clinical Impressions(s) / UC Diagnoses   Final diagnoses:  Pyuria     Discharge Instructions     Other tests will be ready tomorrow.    ED Prescriptions    Medication Sig Dispense Auth. Provider   cephALEXin (KEFLEX) 500 MG capsule Take 1 capsule (500 mg total) by mouth in the morning, at noon, and at bedtime. 21 capsule Elvina Sidle, MD     I have reviewed the PDMP during this encounter.   Elvina Sidle, MD 06/17/20 [phone]

## 2020-06-18 ENCOUNTER — Telehealth (HOSPITAL_COMMUNITY): Payer: Self-pay | Admitting: Emergency Medicine

## 2020-06-18 LAB — CERVICOVAGINAL ANCILLARY ONLY
Chlamydia: POSITIVE — AB
Comment: NEGATIVE
Comment: NEGATIVE
Comment: NORMAL
Neisseria Gonorrhea: NEGATIVE
Trichomonas: NEGATIVE

## 2020-06-18 MED ORDER — AZITHROMYCIN 250 MG PO TABS: 1000.0000 mg | ORAL_TABLET | Freq: Once | ORAL | 0 refills | Status: AC

## 2020-06-19 LAB — URINE CULTURE: Culture: >=100000 — AB

## 2020-08-24 ENCOUNTER — Encounter: Payer: Self-pay | Admitting: Emergency Medicine

## 2020-08-24 ENCOUNTER — Ambulatory Visit
Admission: EM | Admit: 2020-08-24 | Discharge: 2020-08-24 | Discharge status: Against Medical Advice | Payer: BC Managed Care – PPO

## 2020-08-24 ENCOUNTER — Other Ambulatory Visit: Payer: Self-pay

## 2020-08-24 ENCOUNTER — Ambulatory Visit
Admission: EM | Admit: 2020-08-24 | Discharge: 2020-08-24 | Disposition: A | Discharge status: Home / Self Care | Payer: BC Managed Care – PPO | Attending: Urgent Care | Admitting: Urgent Care

## 2020-08-24 DIAGNOSIS — Z8619 Personal history of other infectious and parasitic diseases: Secondary | ICD-10-CM | POA: Insufficient documentation

## 2020-08-24 DIAGNOSIS — R109 Unspecified abdominal pain: Secondary | ICD-10-CM

## 2020-08-24 LAB — POCT URINALYSIS DIP (MANUAL ENTRY)
Bilirubin, UA: NEGATIVE
Blood, UA: NEGATIVE
Glucose, UA: NEGATIVE mg/dL
Ketones, POC UA: NEGATIVE mg/dL
Leukocytes, UA: NEGATIVE
Nitrite, UA: NEGATIVE
Protein Ur, POC: NEGATIVE mg/dL
Spec Grav, UA: 1.02 (ref 1.010–1.025)
Urobilinogen, UA: 1 E.U./dL
pH, UA: 7 (ref 5.0–8.0)

## 2020-08-24 LAB — POCT URINE PREGNANCY: Preg Test, Ur: NEGATIVE

## 2020-08-24 MED ORDER — NAPROXEN 500 MG PO TABS: 500.0000 mg | ORAL_TABLET | Freq: Two times a day (BID) | ORAL | 0 refills | Status: DC

## 2020-08-24 NOTE — ED Provider Notes (Signed)
[facility]   MRN: [id number] DOB: 2000/08/09  Subjective:   Wendy Aguilar is a 20 y.o. female presenting for 2-day history of acute onset mild intermittent menstrual cramping in her mid abdomen.  LMP was 07/11/2020.  She is not on birth control.  Denies fever, nausea, vomiting, diarrhea, constipation, vaginal discharge, dysuria, urinary frequency, genital rash.  Of note, at the end of November patient was treated for chlamydia.  She did finish taking doxycycline as prescribed.  She is not opposed to getting checked for this again.  She is sexually active with 1 female partner, does not use condoms for protection.  Denies history of uterine fibroids, ovarian cyst.  Denies taking chronic medications.  No Known Allergies  Past Medical History:  Diagnosis Date  . Medical history non-contributory      Past Surgical History:  Procedure Laterality Date  . CESAREAN SECTION N/A 10/10/2016   Procedure: CESAREAN SECTION;  Surgeon: Adam Phenix, MD;  Location: [facility] BIRTHING SUITES;  Service: Obstetrics;  Laterality: N/A;  . CESAREAN SECTION N/A 03/19/2018   Procedure: CESAREAN SECTION;  Surgeon: Conan Bowens, MD;  Location: [facility] BIRTHING SUITES;  Service: Obstetrics;  Laterality: N/A;    Family History  Problem Relation Age of Onset  . Alcohol abuse Neg Hx   . Arthritis Neg Hx   . Asthma Neg Hx   . Birth defects Neg Hx   . COPD Neg Hx   . Depression Neg Hx   . Diabetes Neg Hx   . Drug abuse Neg Hx   . Early death Neg Hx   . Hearing loss Neg Hx   . Heart disease Neg Hx   . Hyperlipidemia Neg Hx   . Hypertension Neg Hx   . Kidney disease Neg Hx   . Learning disabilities Neg Hx   . Mental illness Neg Hx   . Mental retardation Neg Hx   . Miscarriages / Stillbirths Neg Hx   . Stroke Neg Hx   . Vision loss Neg Hx   . Varicose Veins Neg Hx     Social History   Tobacco Use  . Smoking status: Never Smoker  . Smokeless tobacco: Never Used  Vaping Use  . Vaping Use:  Never used  Substance Use Topics  . Alcohol use: No  . Drug use: No    ROS   Objective:   Vitals: BP 118/72 (BP Location: Left Arm)   Pulse 73   Temp 98.4 F (36.9 C) (Oral)   Resp 16   Ht 5\' 9"  (1.753 m)   Wt 176 lb (79.8 kg)   LMP 07/11/2020 (Exact Date)   SpO2 98%   BMI 25.99 kg/m   Physical Exam Constitutional:      General: She is not in acute distress.    Appearance: Normal appearance. She is well-developed and normal weight. She is not ill-appearing, toxic-appearing or diaphoretic.  HENT:     Head: Normocephalic and atraumatic.     Right Ear: External ear normal.     Left Ear: External ear normal.     Nose: Nose normal.     Mouth/Throat:     Mouth: Mucous membranes are moist.     Pharynx: Oropharynx is clear.  Eyes:     General: No scleral icterus.    Extraocular Movements: Extraocular movements intact.     Pupils: Pupils are equal, round, and reactive to light.  Cardiovascular:     Rate and Rhythm: Normal rate and regular rhythm.  Heart sounds: Normal heart sounds. No murmur heard. No friction rub. No gallop.   Pulmonary:     Effort: Pulmonary effort is normal. No respiratory distress.     Breath sounds: Normal breath sounds. No stridor. No wheezing, rhonchi or rales.  Abdominal:     General: Bowel sounds are normal. There is no distension.     Palpations: Abdomen is soft. There is no mass.     Tenderness: There is abdominal tenderness (mild on deep palpation) in the periumbilical area. There is no right CVA tenderness, left CVA tenderness, guarding or rebound.  Skin:    General: Skin is warm and dry.     Coloration: Skin is not pale.     Findings: No rash.  Neurological:     General: No focal deficit present.     Mental Status: She is alert and oriented to person, place, and time.  Psychiatric:        Mood and Affect: Mood normal.        Behavior: Behavior normal.        Thought Content: Thought content normal.        Judgment: Judgment  normal.     Results for orders placed or performed during the hospital encounter of 08/24/20 (from the past 24 hour(s))  POCT urinalysis dipstick     Status: None   Collection Time: 08/24/20  2:27 PM  Result Value Ref Range   Color, UA yellow yellow   Clarity, UA clear clear   Glucose, UA negative negative mg/dL   Bilirubin, UA negative negative   Ketones, POC UA negative negative mg/dL   Spec Grav, UA 4.650 3.546 - 1.025   Blood, UA negative negative   pH, UA 7.0 5.0 - 8.0   Protein Ur, POC negative negative mg/dL   Urobilinogen, UA 1.0 0.2 or 1.0 E.U./dL   Nitrite, UA Negative Negative   Leukocytes, UA Negative Negative  POCT urine pregnancy     Status: None   Collection Time: 08/24/20  2:27 PM  Result Value Ref Range   Preg Test, Ur Negative Negative    Assessment and Plan :   PDMP not reviewed this encounter.  1. Abdominal cramps   2. History of chlamydia     Recommended naproxen for pain and inflammation, better hydration.  STI recheck pending.  Recommended establishing care with a gynecologist to pursue imaging such as transvaginal and pelvic ultrasound.  At this time, patient does not have signs of an acute abdomen, PID. Counseled patient on potential for adverse effects with medications prescribed/recommended today, ER and return-to-clinic precautions discussed, patient verbalized understanding.    Wallis Bamberg, PA-C 08/24/20 1450

## 2020-08-24 NOTE — ED Triage Notes (Signed)
Pt c/o of having menstrual cramping x 2 days. Her last menstrual cycle was 07/11/20.

## 2020-08-25 ENCOUNTER — Telehealth (HOSPITAL_COMMUNITY): Payer: Self-pay | Admitting: Emergency Medicine

## 2020-08-25 LAB — CERVICOVAGINAL ANCILLARY ONLY
Bacterial Vaginitis (gardnerella): POSITIVE — AB
Chlamydia: NEGATIVE
Comment: NEGATIVE
Comment: NEGATIVE
Comment: NEGATIVE
Comment: NORMAL
Neisseria Gonorrhea: NEGATIVE
Trichomonas: NEGATIVE

## 2020-08-25 MED ORDER — METRONIDAZOLE 500 MG PO TABS: 500.0000 mg | ORAL_TABLET | Freq: Two times a day (BID) | ORAL | 0 refills | Status: DC

## 2020-09-23 ENCOUNTER — Ambulatory Visit: Payer: BC Managed Care – PPO | Admitting: Nurse Practitioner

## 2020-10-03 ENCOUNTER — Telehealth: Payer: BC Managed Care – PPO | Admitting: Emergency Medicine

## 2020-10-03 DIAGNOSIS — R059 Cough, unspecified: Secondary | ICD-10-CM | POA: Diagnosis not present

## 2020-10-03 MED ORDER — PROMETHAZINE-PHENYLEPHRINE 6.25-5 MG/5ML PO SYRP: 5.0000 mL | ORAL_SOLUTION | Freq: Four times a day (QID) | ORAL | 0 refills | Status: DC | PRN

## 2020-10-03 NOTE — Progress Notes (Signed)
We are sorry you are not feeling well.  Here is how we plan to help!  Based on what you have shared with me, it looks like you may have a viral upper respiratory infection.  Upper respiratory infections are caused by a large number of viruses; however, rhinovirus is the most common cause.   Symptoms vary from person to person, with common symptoms including sore throat, cough, fatigue or lack of energy and feeling of general discomfort.  A low-grade fever of up to 100.4 may present, but is often uncommon.  Symptoms vary however, and are closely related to a person's age or underlying illnesses.  The most common symptoms associated with an upper respiratory infection are nasal discharge or congestion, cough, sneezing, headache and pressure in the ears and face.  These symptoms usually persist for about 3 to 10 days, but can last up to 2 weeks.  It is important to know that upper respiratory infections do not cause serious illness or complications in most cases.    Upper respiratory infections can be transmitted from person to person, with the most common method of transmission being a person's hands.  The virus is able to live on the skin and can infect other persons for up to 2 hours after direct contact.  Also, these can be transmitted when someone coughs or sneezes; thus, it is important to cover the mouth to reduce this risk.  To keep the spread of the illness at bay, good hand hygiene is very important.  This is an infection that is most likely caused by a virus. There are no specific treatments other than to help you with the symptoms until the infection runs its course.  We are sorry you are not feeling well.  Here is how we plan to help!   For nasal congestion, you may use an oral decongestants such as Mucinex D or if you have glaucoma or high blood pressure use plain Mucinex.  Saline nasal spray or nasal drops can help and can safely be used as often as needed for congestion.  For your congestion,  I have prescribed Fluticasone nasal spray one spray in each nostril twice a day  If you do not have a history of heart disease, hypertension, diabetes or thyroid disease, prostate/bladder issues or glaucoma, you may also use Sudafed to treat nasal congestion.  It is highly recommended that you consult with a pharmacist or your primary care physician to ensure this medication is safe for you to take.     If you have a cough, you may use cough suppressants such as Delsym and Robitussin.  If you have glaucoma or high blood pressure, you can also use Coricidin HBP.   For cough I have prescribed for you Promethazine cough syrup.  Take as directed.  If you have a sore or scratchy throat, use a saltwater gargle-  to  teaspoon of salt dissolved in a 4-ounce to 8-ounce glass of warm water.  Gargle the solution for approximately 15-30 seconds and then spit.  It is important not to swallow the solution.  You can also use throat lozenges/cough drops and Chloraseptic spray to help with throat pain or discomfort.  Warm or cold liquids can also be helpful in relieving throat pain.  For headache, pain or general discomfort, you can use Ibuprofen or Tylenol as directed.   Some authorities believe that zinc sprays or the use of Echinacea may shorten the course of your symptoms.   HOME CARE . Only  take medications as instructed by your medical team. . Be sure to drink plenty of fluids. Water is fine as well as fruit juices, sodas and electrolyte beverages. You may want to stay away from caffeine or alcohol. If you are nauseated, try taking small sips of liquids. How do you know if you are getting enough fluid? Your urine should be a pale yellow or almost colorless. . Get rest. . Taking a steamy shower or using a humidifier may help nasal congestion and ease sore throat pain. You can place a towel over your head and breathe in the steam from hot water coming from a faucet. . Using a saline nasal spray works much the  same way. . Cough drops, hard candies and sore throat lozenges may ease your cough. . Avoid close contacts especially the very young and the elderly . Cover your mouth if you cough or sneeze . Always remember to wash your hands.   GET HELP RIGHT AWAY IF: . You develop worsening fever. . If your symptoms do not improve within 10 days . You develop yellow or green discharge from your nose over 3 days. . You have coughing fits . You develop a severe head ache or visual changes. . You develop shortness of breath, difficulty breathing or start having chest pain . Your symptoms persist after you have completed your treatment plan  MAKE SURE YOU   Understand these instructions.  Will watch your condition.  Will get help right away if you are not doing well or get worse.  Your e-visit answers were reviewed by a board certified advanced clinical practitioner to complete your personal care plan. Depending upon the condition, your plan could have included both over the counter or prescription medications. Please review your pharmacy choice. If there is a problem, you may call our nursing hot line at and have the prescription routed to another pharmacy. Your safety is important to [place]. If you have drug allergies check your prescription carefully.   You can use MyChart to ask questions about today's visit, request a non-urgent call back, or ask for a work or school excuse for 24 hours related to this e-Visit. If it has been greater than 24 hours you will need to follow up with your provider, or enter a new e-Visit to address those concerns. You will get an e-mail in the next two days asking about your experience.  I hope that your e-visit has been valuable and will speed your recovery. Thank you for using e-visits.     Approximately 5 minutes was used in reviewing the patient's chart, questionnaire, prescribing medications, and documentation.

## 2020-10-24 ENCOUNTER — Telehealth: Payer: BC Managed Care – PPO | Admitting: Emergency Medicine

## 2020-10-24 DIAGNOSIS — R109 Unspecified abdominal pain: Secondary | ICD-10-CM

## 2020-10-24 NOTE — Progress Notes (Signed)
Based on what you shared with me, I feel your condition warrants further evaluation and I recommend that you be seen for a face to face office visit. °  °NOTE: If you entered your credit card information for this eVisit, you will not be charged. You may see a "hold" on your card for the $35 but that hold will drop off and you will not have a charge processed. °  °If you are having a true medical emergency please call 911.   °  ° For an urgent face to face visit, [facility] has five urgent care centers for your convenience:  °  ° [facility] [place] °Get Driving Directions °[phone] °[address] °[place], [place] [address] °• 10 am - 6pm Monday - Friday °  ° [facility]) °Get Driving Directions °[phone] °[address] °[place], [place] [address] °• 10 am to 8 pm Monday-Friday °• 12 pm to 8 pm Saturday-Sunday °  °  °[facility] °Get Driving Directions °[phone] °[address], [address] °, [place] [address] °• 8 am to 8 pm Monday-Friday °• 9 am to 6 pm Saturday °• 11 am to 6 pm Sunday °  °  °[facility] [place] °Get Driving Directions  °[phone] °[address] °[address] °[place], [place] [address] °• 8 am to 8 pm Monday-Friday °• 8 am to 4 pm Saturday-Sunday °  °[facility] °Get Driving Directions °[phone] °[address] °[place], [place] [address] °• 12 pm to 6 pm Monday-Friday  °  °  °Your e-visit answers were reviewed by a board certified advanced clinical practitioner to complete your personal care plan.  Thank you for using e-Visits. °  ° °Approximately 5 minutes was spent documenting and reviewing patient's chart. ° °

## 2021-01-09 ENCOUNTER — Emergency Department (HOSPITAL_COMMUNITY)
Admission: EM | Admit: 2021-01-09 | Discharge: 2021-01-09 | Disposition: A | Discharge status: Home / Self Care | Payer: BC Managed Care – PPO | Attending: Emergency Medicine | Admitting: Emergency Medicine

## 2021-01-09 ENCOUNTER — Other Ambulatory Visit: Payer: Self-pay

## 2021-01-09 DIAGNOSIS — S0181XA Laceration without foreign body of other part of head, initial encounter: Secondary | ICD-10-CM | POA: Diagnosis not present

## 2021-01-09 DIAGNOSIS — S0993XA Unspecified injury of face, initial encounter: Secondary | ICD-10-CM | POA: Diagnosis present

## 2021-01-09 DIAGNOSIS — W228XXA Striking against or struck by other objects, initial encounter: Secondary | ICD-10-CM | POA: Insufficient documentation

## 2021-01-09 MED ORDER — CEPHALEXIN 500 MG PO CAPS: 500.0000 mg | ORAL_CAPSULE | Freq: Four times a day (QID) | ORAL | 0 refills | Status: DC

## 2021-01-09 NOTE — ED Triage Notes (Signed)
Pt hit her face on the corner of a dresser when helping friends move yesterday. Swelling, lac, and pain to same.

## 2021-01-09 NOTE — Discharge Instructions (Signed)
Please read and follow all provided instructions.  Your diagnoses today include:  1. Facial laceration, initial encounter     Tests performed today include: Vital signs. See below for your results today.   Medications prescribed:  Keflex (cephalexin) - antibiotic  You have been prescribed an antibiotic medicine: take the entire course of medicine even if you are feeling better. Stopping early can cause the antibiotic not to work.  Take any prescribed medications only as directed.   Home care instructions:  Follow any educational materials and wound care instructions contained in this packet.   Keep affected area above the level of your heart when possible to minimize swelling. Wash area gently twice a day with warm soapy water. Do not apply alcohol or hydrogen peroxide. Cover the area if it draining or weeping.   Return instructions:  Return to the Emergency Department if you have: Fever Worsening pain Worsening swelling of the wound Pus draining from the wound Redness of the skin that moves away from the wound, especially if it streaks away from the affected area  Any other emergent concerns  Your vital signs today were: BP (!) 141/72 (BP Location: Left Arm)   Pulse 78   Temp 99.1 F (37.3 C) (Oral)   Resp 16   SpO2 100%  If your blood pressure (BP) was elevated above 135/85 this visit, please have this repeated by your doctor within one month. --------------

## 2021-01-09 NOTE — ED Provider Notes (Signed)
[facility] EMERGENCY DEPARTMENT Provider Note   CSN: [id number] Arrival date & time: 01/09/21  1154     History No chief complaint on file.   Wendy Aguilar is a 20 y.o. female.  Patient presents to the emergency department for evaluation of left facial laceration sustained yesterday morning, now greater than 24 hours ago, while moving furniture.  She states that she slipped while trying to unload a dresser from a [employer] truck.  Her face hit the corner of the dresser and she sustained a laceration.  She had bleeding afterwards.  She had a lot of facial swelling which is improving with time.  She did not get knocked out or lose consciousness.  She has been able to chew and eat normally.  No neck pain.  She has been taking Aleve and applying ice.  Patient states that she is here mainly to get an antibiotic to help prevent an infection.      Past Medical History:  Diagnosis Date   Medical history non-contributory     Patient Active Problem List   Diagnosis Date Noted   'light-for-dates' infant with signs of fetal malnutrition 03/19/2018   False labor 03/12/2018   NST (non-stress test) reactive 03/12/2018   Encounter for fetal anatomic survey    Late prenatal care affecting pregnancy in second trimester    Supervision of other normal pregnancy, antepartum 10/21/2017   History of cesarean section 10/21/2017    Past Surgical History:  Procedure Laterality Date   CESAREAN SECTION N/A 10/10/2016   Procedure: CESAREAN SECTION;  Surgeon: Adam Phenix, MD;  Location: [facility] BIRTHING SUITES;  Service: Obstetrics;  Laterality: N/A;   CESAREAN SECTION N/A 03/19/2018   Procedure: CESAREAN SECTION;  Surgeon: Conan Bowens, MD;  Location: [facility] BIRTHING SUITES;  Service: Obstetrics;  Laterality: N/A;     OB History     Gravida  2   Para  2   Term  2   Preterm      AB      Living  2      SAB      IAB      Ectopic      Multiple  0   Live Births  2            Family History  Problem Relation Age of Onset   Alcohol abuse Neg Hx    Arthritis Neg Hx    Asthma Neg Hx    Birth defects Neg Hx    COPD Neg Hx    Depression Neg Hx    Diabetes Neg Hx    Drug abuse Neg Hx    Early death Neg Hx    Hearing loss Neg Hx    Heart disease Neg Hx    Hyperlipidemia Neg Hx    Hypertension Neg Hx    Kidney disease Neg Hx    Learning disabilities Neg Hx    Mental illness Neg Hx    Mental retardation Neg Hx    Miscarriages / Stillbirths Neg Hx    Stroke Neg Hx    Vision loss Neg Hx    Varicose Veins Neg Hx     Social History   Tobacco Use   Smoking status: Never   Smokeless tobacco: Never  Vaping Use   Vaping Use: Never used  Substance Use Topics   Alcohol use: No   Drug use: No    Home Medications Prior to Admission medications   Medication Sig  Start Date End Date Taking? Authorizing Provider  cephALEXin (KEFLEX) 500 MG capsule Take 1 capsule (500 mg total) by mouth 4 (four) times daily. 01/09/21  Yes Renne Crigler, PA-C    Allergies    Patient has no known allergies.  Review of Systems   Review of Systems  Constitutional:  Negative for fatigue.  HENT:  Negative for tinnitus.   Eyes:  Negative for photophobia, pain and visual disturbance.  Respiratory:  Negative for shortness of breath.   Cardiovascular:  Negative for chest pain.  Gastrointestinal:  Negative for nausea and vomiting.  Musculoskeletal:  Negative for back pain, gait problem and neck pain.  Skin:  Positive for wound.  Neurological:  Negative for dizziness, weakness, light-headedness, numbness and headaches.  Psychiatric/Behavioral:  Negative for confusion and decreased concentration.    Physical Exam Updated Vital Signs BP (!) 141/72 (BP Location: Left Arm)   Pulse 78   Temp 99.1 F (37.3 C) (Oral)   Resp 16   SpO2 100%   Physical Exam Vitals and nursing note reviewed.  Constitutional:      Appearance: She is well-developed.  HENT:     Head:  Normocephalic.     Comments: 1cm linear laceration overlying left maxilla. There is scab overlying the wound.  No drainage or bleeding.  No loose teeth. Associated soft tissue swelling. Internally, there is a 1.5cm laceration with some bruising of the surrounding soft tissue. Wound is closed with some white granulation tissue bridging the wound edges. No obvious gum trauma. No TMJ pain.     Right Ear: External ear normal.     Left Ear: External ear normal.     Nose: Nose normal. No congestion.     Mouth/Throat:     Mouth: Mucous membranes are moist.  Eyes:     Conjunctiva/sclera: Conjunctivae normal.  Pulmonary:     Effort: No respiratory distress.  Musculoskeletal:     Cervical back: Normal range of motion and neck supple.  Skin:    General: Skin is warm and dry.  Neurological:     Mental Status: She is alert.    ED Results / Procedures / Treatments   Labs (all labs ordered are listed, but only abnormal results are displayed) Labs Reviewed - No data to display  EKG None  Radiology No results found.  Procedures Procedures   Medications Ordered in ED Medications - No data to display  ED Course  I have reviewed the triage vital signs and the nursing notes.  Pertinent labs & imaging results that were available during my care of the patient were reviewed by me and considered in my medical decision making (see chart for details).  Patient seen and examined.   Vital signs reviewed and are as follows: BP (!) 141/72 (BP Location: Left Arm)   Pulse 78   Temp 99.1 F (37.3 C) (Oral)   Resp 16   SpO2 100%   Patient with through and through cheek laceration, greater than 24 hours since injury.  Wound is already closing with early signs of healing present.  Discussed proper wound care with patient.  She is adamant about getting antibiotic to help prevent infection.  Discussed that facial wounds are low risk in general for infection.  Will give Keflex 500 mg for 5 days.  We  discussed that she will have scarring and discussed typical remodeling of skin after an injury like this.  I reassured her that the swelling of her face will eventually improve, likely over  the next few days.     MDM Rules/Calculators/A&P                          Facial laceration: Wound greater than 24 hours old, not a candidate for repair or closure.  Patient is more interested in antibiotics today.  Wound is through and through.  She was counseled on wound care and keeping the internal wound clean.  Facial trauma: Forage motion of the jaw without point tenderness over the maxilla or mandible.  Overall have low concern for jaw fracture or facial fracture.  Patient does not have any signs of a significant closed head injury.   Final Clinical Impression(s) / ED Diagnoses Final diagnoses:  Facial laceration, initial encounter    Rx / DC Orders ED Discharge Orders          Ordered    cephALEXin (KEFLEX) 500 MG capsule  4 times daily        01/09/21 1511             Renne Crigler, PA-C 01/09/21 1520    Cathren Laine, MD 01/09/21 1615

## 2021-01-09 NOTE — ED Provider Notes (Signed)
Emergency Medicine Provider Triage Evaluation Note  Wendy Aguilar , a 20 y.o. female  was evaluated in triage.  Pt complains of left facial swelling. States she tripped yesterday and hit her face on the corner of a dresser  Review of Systems  Positive: Facial trauma/swelling, laceration Negative: No loc  Physical Exam  BP (!) 141/72 (BP Location: Left Arm)   Pulse 78   Temp 99.1 F (37.3 C) (Oral)   Resp 16   SpO2 100%  Gen:   Awake, no distress   Resp:  Normal effort  MSK:   Moves extremities without difficulty  Other:  1 laceration to the left cheek  Medical Decision Making  Medically screening exam initiated at 12:22 PM.  Appropriate orders placed.  Wendy A Sondgeroth was informed that the remainder of the evaluation will be completed by another provider, this initial triage assessment does not replace that evaluation, and the importance of remaining in the ED until their evaluation is complete.     Karrie Meres, PA-C 01/09/21 1223    Vanetta Mulders, MD 01/10/21 [id number]

## 2021-01-12 ENCOUNTER — Telehealth: Payer: Self-pay

## 2021-01-12 NOTE — Telephone Encounter (Signed)
Transition Care Management Follow-up Telephone Call Date of discharge and from where: 01/09/2021 from [facility] How have you been since you were released from the hospital? Pt stated that she is feeling well and has no questions or concerns.  Any questions or concerns? No  Items Reviewed: Did the pt receive and understand the discharge instructions provided? Yes  Medications obtained and verified? Yes  Other? No  Any new allergies since your discharge? No  Dietary orders reviewed? No Do you have support at home? Yes   Functional Questionnaire: (I = Independent and D = Dependent) ADLs: I  Bathing/Dressing- I  Meal Prep- I  Eating- I  Maintaining continence- I  Transferring/Ambulation- I  Managing Meds- I  Follow up appointments reviewed:  PCP Hospital f/u appt confirmed? No   Specialist Hospital f/u appt confirmed? No   Are transportation arrangements needed? No  If their condition worsens, is the pt aware to call PCP or go to the Emergency Dept.? Yes Was the patient provided with contact information for the PCP's office or ED? Yes Was to pt encouraged to call back with questions or concerns? Yes

## 2021-01-20 ENCOUNTER — Telehealth: Payer: BC Managed Care – PPO | Admitting: Physician Assistant

## 2021-01-20 DIAGNOSIS — B379 Candidiasis, unspecified: Secondary | ICD-10-CM

## 2021-01-20 DIAGNOSIS — T3695XA Adverse effect of unspecified systemic antibiotic, initial encounter: Secondary | ICD-10-CM | POA: Diagnosis not present

## 2021-01-20 MED ORDER — FLUCONAZOLE 150 MG PO TABS: 150.0000 mg | ORAL_TABLET | Freq: Once | ORAL | 0 refills | Status: AC

## 2021-01-20 NOTE — Progress Notes (Signed)
I have spent 5 minutes in review of e-visit questionnaire, review and updating patient chart, medical decision making and response to patient.   Leeann Bady Cody Sharayah Renfrow, PA-C    

## 2021-01-20 NOTE — Progress Notes (Signed)

## 2021-01-25 ENCOUNTER — Telehealth: Payer: BC Managed Care – PPO | Admitting: Emergency Medicine

## 2021-01-25 DIAGNOSIS — J069 Acute upper respiratory infection, unspecified: Secondary | ICD-10-CM

## 2021-01-25 MED ORDER — AZITHROMYCIN 250 MG PO TABS: ORAL_TABLET | ORAL | 0 refills | Status: AC

## 2021-01-25 MED ORDER — BENZONATATE 100 MG PO CAPS
100.0000 mg | ORAL_CAPSULE | Freq: Three times a day (TID) | ORAL | 0 refills | Status: DC | PRN
Start: 2021-01-25 — End: 2021-05-27

## 2021-01-25 NOTE — Progress Notes (Signed)

## 2021-03-26 ENCOUNTER — Other Ambulatory Visit: Payer: Self-pay

## 2021-03-26 ENCOUNTER — Ambulatory Visit
Admission: EM | Admit: 2021-03-26 | Discharge: 2021-03-26 | Disposition: A | Discharge status: Home / Self Care | Payer: Medicaid Other | Attending: Family Medicine | Admitting: Family Medicine

## 2021-03-26 DIAGNOSIS — H66001 Acute suppurative otitis media without spontaneous rupture of ear drum, right ear: Secondary | ICD-10-CM | POA: Diagnosis not present

## 2021-03-26 MED ORDER — AMOXICILLIN 875 MG PO TABS: 875.0000 mg | ORAL_TABLET | Freq: Two times a day (BID) | ORAL | 0 refills | Status: DC

## 2021-03-26 MED ORDER — FLUTICASONE PROPIONATE 50 MCG/ACT NA SUSP: 1.0000 | Freq: Two times a day (BID) | NASAL | 0 refills | Status: DC

## 2021-03-26 MED ORDER — PSEUDOEPHEDRINE HCL 30 MG PO TABS: 30.0000 mg | ORAL_TABLET | Freq: Three times a day (TID) | ORAL | 0 refills | Status: DC | PRN

## 2021-03-26 NOTE — ED Provider Notes (Signed)
[name]    CSN: [id number] Arrival date & time: 03/26/21  1227      History   Chief Complaint Chief Complaint  Patient presents with   Cerumen Impaction    right    HPI Wendy Aguilar is a 20 y.o. female.   Here today with several week history of right ear fullness, pressure, intermittent pain and now muffled hearing. States she had a PA at work look in the ear and was told the ear was impacted. She bought OTC ear wax removal kits and tried this for an hour with no relief of sxs. Denies fever, chills, drainage, headache, hx of ear infections.    Past Medical History:  Diagnosis Date   Medical history non-contributory     Patient Active Problem List   Diagnosis Date Noted   'light-for-dates' infant with signs of fetal malnutrition 03/19/2018   False labor 03/12/2018   NST (non-stress test) reactive 03/12/2018   Encounter for fetal anatomic survey    Late prenatal care affecting pregnancy in second trimester    Supervision of other normal pregnancy, antepartum 10/21/2017   History of cesarean section 10/21/2017    Past Surgical History:  Procedure Laterality Date   CESAREAN SECTION N/A 10/10/2016   Procedure: CESAREAN SECTION;  Surgeon: Adam Phenix, MD;  Location: [facility] BIRTHING SUITES;  Service: Obstetrics;  Laterality: N/A;   CESAREAN SECTION N/A 03/19/2018   Procedure: CESAREAN SECTION;  Surgeon: Conan Bowens, MD;  Location: [facility] BIRTHING SUITES;  Service: Obstetrics;  Laterality: N/A;    OB History     Gravida  2   Para  2   Term  2   Preterm      AB      Living  2      SAB      IAB      Ectopic      Multiple  0   Live Births  2            Home Medications    Prior to Admission medications   Medication Sig Start Date End Date Taking? Authorizing Provider  amoxicillin (AMOXIL) 875 MG tablet Take 1 tablet (875 mg total) by mouth 2 (two) times daily. 03/26/21  Yes Particia Nearing, PA-C  fluticasone [facility]) 50  MCG/ACT nasal spray Place 1 spray into both nostrils 2 (two) times daily. 03/26/21  Yes Particia Nearing, PA-C  pseudoephedrine (SUDAFED) 30 MG tablet Take 1 tablet (30 mg total) by mouth every 8 (eight) hours as needed for congestion. 03/26/21  Yes Particia Nearing, PA-C  benzonatate (TESSALON) 100 MG capsule Take 1-2 capsules (100-200 mg total) by mouth 3 (three) times daily as needed. 01/25/21   Lurene Shadow, PA-C  cephALEXin (KEFLEX) 500 MG capsule Take 1 capsule (500 mg total) by mouth 4 (four) times daily. 01/09/21   Renne Crigler, PA-C    Family History Family History  Problem Relation Age of Onset   Alcohol abuse Neg Hx    Arthritis Neg Hx    Asthma Neg Hx    Birth defects Neg Hx    COPD Neg Hx    Depression Neg Hx    Diabetes Neg Hx    Drug abuse Neg Hx    Early death Neg Hx    Hearing loss Neg Hx    Heart disease Neg Hx    Hyperlipidemia Neg Hx    Hypertension Neg Hx    Kidney disease Neg Hx  Learning disabilities Neg Hx    Mental illness Neg Hx    Mental retardation Neg Hx    Miscarriages / Stillbirths Neg Hx    Stroke Neg Hx    Vision loss Neg Hx    Varicose Veins Neg Hx     Social History Social History   Tobacco Use   Smoking status: Never   Smokeless tobacco: Never  Vaping Use   Vaping Use: Never used  Substance Use Topics   Alcohol use: No   Drug use: No     Allergies   Patient has no known allergies.   Review of Systems Review of Systems PER HPI   Physical Exam Triage Vital Signs ED Triage Vitals  Enc Vitals Group     BP 03/26/21 1241 116/64     Pulse Rate 03/26/21 1241 74     Resp 03/26/21 1241 18     Temp 03/26/21 1241 98.5 F (36.9 C)     Temp Source 03/26/21 1241 Oral     SpO2 03/26/21 1241 98 %     Weight --      Height --      Head Circumference --      Peak Flow --      Pain Score 03/26/21 1246 0     Pain Loc --      Pain Edu? --      Excl. in GC? --    No data found.  Updated Vital Signs BP 116/64 (BP  Location: Left Arm)   Pulse 74   Temp 98.5 F (36.9 C) (Oral)   Resp 18   LMP 03/14/2021 (Approximate)   SpO2 98%   Visual Acuity Right Eye Distance:   Left Eye Distance:   Bilateral Distance:    Right Eye Near:   Left Eye Near:    Bilateral Near:     Physical Exam Vitals and nursing note reviewed.  Constitutional:      Appearance: Normal appearance. She is not ill-appearing.  HENT:     Head: Atraumatic.     Right Ear: Ear canal and external ear normal. There is no impacted cerumen.     Left Ear: Tympanic membrane, ear canal and external ear normal. There is no impacted cerumen.     Ears:     Comments: Right TM bulging, injected, erythematous with cloudy fluid    Nose: Nose normal.     Mouth/Throat:     Mouth: Mucous membranes are moist.     Pharynx: Oropharynx is clear.  Eyes:     Extraocular Movements: Extraocular movements intact.     Conjunctiva/sclera: Conjunctivae normal.  Cardiovascular:     Rate and Rhythm: Normal rate and regular rhythm.     Heart sounds: Normal heart sounds.  Pulmonary:     Effort: Pulmonary effort is normal.     Breath sounds: Normal breath sounds. No wheezing or rales.  Abdominal:     General: Bowel sounds are normal. There is no distension.     Palpations: Abdomen is soft.     Tenderness: There is no abdominal tenderness. There is no guarding.  Musculoskeletal:        General: Normal range of motion.     Cervical back: Normal range of motion and neck supple.  Skin:    General: Skin is warm and dry.  Neurological:     Mental Status: She is alert and oriented to person, place, and time.  Psychiatric:  Mood and Affect: Mood normal.        Thought Content: Thought content normal.        Judgment: Judgment normal.     UC Treatments / Results  Labs (all labs ordered are listed, but only abnormal results are displayed) Labs Reviewed - No data to display  EKG   Radiology No results found.  Procedures Procedures  (including critical care time)  Medications Ordered in UC Medications - No data to display  Initial Impression / Assessment and Plan / UC Course  I have reviewed the triage vital signs and the nursing notes.  Pertinent labs & imaging results that were available during my care of the patient were reviewed by me and considered in my medical decision making (see chart for details).     No evidence of current cerumen impaction, but does appear to be developing an ear infection. Will treat with amoxil, flonase, sudafed and OTC pain relievers prn. Return for worsening sxs.   Final Clinical Impressions(s) / UC Diagnoses   Final diagnoses:  Non-recurrent acute suppurative otitis media of right ear without spontaneous rupture of tympanic membrane   Discharge Instructions   None    ED Prescriptions     Medication Sig Dispense Auth. Provider   amoxicillin (AMOXIL) 875 MG tablet Take 1 tablet (875 mg total) by mouth 2 (two) times daily. 20 tablet Particia Nearing, PA-C   fluticasone [facility]) 50 MCG/ACT nasal spray Place 1 spray into both nostrils 2 (two) times daily. 16 g Particia Nearing, [place]   pseudoephedrine (SUDAFED) 30 MG tablet Take 1 tablet (30 mg total) by mouth every 8 (eight) hours as needed for congestion. 15 tablet Particia Nearing, [place]      PDMP not reviewed this encounter.   Particia Nearing, [place] 03/26/21 1258

## 2021-03-26 NOTE — ED Triage Notes (Signed)
Two week h/o right ear having a clogged sensation. Notes decreased hearing on the right. Pt has tried an ear irrigation kit with a few small pieces being removed. Also, has tried debrox without a decrease. Denies left ear sxs and uri sxs.

## 2021-05-27 ENCOUNTER — Telehealth: Payer: BC Managed Care – PPO | Admitting: Physician Assistant

## 2021-05-27 DIAGNOSIS — N760 Acute vaginitis: Secondary | ICD-10-CM | POA: Diagnosis not present

## 2021-05-27 DIAGNOSIS — B9689 Other specified bacterial agents as the cause of diseases classified elsewhere: Secondary | ICD-10-CM

## 2021-05-27 MED ORDER — METRONIDAZOLE 500 MG PO TABS: 500.0000 mg | ORAL_TABLET | Freq: Two times a day (BID) | ORAL | 0 refills | Status: DC

## 2021-05-27 MED ORDER — FLUCONAZOLE 150 MG PO TABS: 150.0000 mg | ORAL_TABLET | Freq: Once | ORAL | 0 refills | Status: AC

## 2021-05-27 NOTE — Progress Notes (Signed)
E-Visit for Vaginal Symptoms  We are sorry that you are not feeling well. Here is how we plan to help! Based on what you shared with me it looks like you: May have a vaginosis due to bacteria  Vaginosis is an inflammation of the vagina that can result in discharge, itching and pain. The cause is usually a change in the normal balance of vaginal bacteria or an infection. Vaginosis can also result from reduced estrogen levels after menopause.  The most common causes of vaginosis are:   Bacterial vaginosis which results from an overgrowth of one on several organisms that are normally present in your vagina.   Yeast infections which are caused by a naturally occurring fungus called candida.   Vaginal atrophy (atrophic vaginosis) which results from the thinning of the vagina from reduced estrogen levels after menopause.   Trichomoniasis which is caused by a parasite and is commonly transmitted by sexual intercourse.  Factors that increase your risk of developing vaginosis include: Medications, such as antibiotics and steroids Uncontrolled diabetes Use of hygiene products such as bubble bath, vaginal spray or vaginal deodorant Douching Wearing damp or tight-fitting clothing Using an intrauterine device (IUD) for birth control Hormonal changes, such as those associated with pregnancy, birth control pills or menopause Sexual activity Having a sexually transmitted infection  With the fishy odor there is a higher concern for BV as cause of symptoms. This can sometimes be present along with yeast so your treatment plan is Metronidazole or Flagyl 500mg  twice a day for 7 days and a Diflucan 150 mg tablet once.  I have electronically sent this prescription into the pharmacy that you have chosen.  Be sure to take all of the medication as directed. Stop taking any medication if you develop a rash, tongue swelling or shortness of breath. Mothers who are breast feeding should consider pumping and  discarding their breast milk while on these antibiotics. However, there is no consensus that infant exposure at these doses would be harmful.  Remember that medication creams can weaken latex condoms.   HOME CARE:  Good hygiene may prevent some types of vaginosis from recurring and may relieve some symptoms:  Avoid baths, hot tubs and whirlpool spas. Rinse soap from your outer genital area after a shower, and dry the area well to prevent irritation. Don't use scented or harsh soaps, such as those with deodorant or antibacterial action. Avoid irritants. These include scented tampons and pads. Wipe from front to back after using the toilet. Doing so avoids spreading fecal bacteria to your vagina.  Other things that may help prevent vaginosis include:  Don't douche. Your vagina doesn't require cleansing other than normal bathing. Repetitive douching disrupts the normal organisms that reside in the vagina and can actually increase your risk of vaginal infection. Douching won't clear up a vaginal infection. Use a latex condom. Both female and female latex condoms may help you avoid infections spread by sexual contact. Wear cotton underwear. Also wear pantyhose with a cotton crotch. If you feel comfortable without it, skip wearing underwear to bed. Yeast thrives in [place] Your symptoms should improve in the next day or two.  GET HELP RIGHT AWAY IF:  You have pain in your lower abdomen ( pelvic area or over your ovaries) You develop nausea or vomiting You develop a fever Your discharge changes or worsens You have persistent pain with intercourse You develop shortness of breath, a rapid pulse, or you faint.  These symptoms could be signs  of problems or infections that need to be evaluated by a medical provider now.  MAKE SURE YOU   Understand these instructions. Will watch your condition. Will get help right away if you are not doing well or get worse.  Thank you for choosing  an e-visit.  Your e-visit answers were reviewed by a board certified advanced clinical practitioner to complete your personal care plan. Depending upon the condition, your plan could have included both over the counter or prescription medications.  Please review your pharmacy choice. Make sure the pharmacy is open so you can pick up prescription now. If there is a problem, you may contact your provider through [employer] and have the prescription routed to another pharmacy.  Your safety is important to [place]. If you have drug allergies check your prescription carefully.   For the next 24 hours you can use MyChart to ask questions about today's visit, request a non-urgent call back, or ask for a work or school excuse. You will get an email in the next two days asking about your experience. I hope that your e-visit has been valuable and will speed your recovery.

## 2021-05-27 NOTE — Progress Notes (Signed)
I have spent 5 minutes in review of e-visit questionnaire, review and updating patient chart, medical decision making and response to patient.   Hasina Kreager Cody Huxley Vanwagoner, PA-C    

## 2021-06-29 ENCOUNTER — Ambulatory Visit
Admission: EM | Admit: 2021-06-29 | Discharge: 2021-06-29 | Disposition: A | Discharge status: Home / Self Care | Payer: BC Managed Care – PPO | Attending: Emergency Medicine | Admitting: Emergency Medicine

## 2021-06-29 ENCOUNTER — Encounter: Payer: Self-pay | Admitting: Emergency Medicine

## 2021-06-29 DIAGNOSIS — N898 Other specified noninflammatory disorders of vagina: Secondary | ICD-10-CM | POA: Diagnosis present

## 2021-06-29 LAB — POCT URINALYSIS DIP (MANUAL ENTRY)
Bilirubin, UA: NEGATIVE
Blood, UA: NEGATIVE
Glucose, UA: NEGATIVE mg/dL
Ketones, POC UA: NEGATIVE mg/dL
Leukocytes, UA: NEGATIVE
Nitrite, UA: NEGATIVE
Protein Ur, POC: 30 mg/dL — AB
Spec Grav, UA: 1.025
Urobilinogen, UA: 1 U/dL
pH, UA: 7.5

## 2021-06-29 LAB — OB RESULTS CONSOLE GC/CHLAMYDIA: Gonorrhea: NEGATIVE

## 2021-06-29 NOTE — ED Triage Notes (Addendum)
Pt c/o vaginal discharge x 2 days. 1 month ago pt had an e-visit for what she thought was a yeast infection and was rx medication she has not picked up those medications yet. She does have a new partner and would like STD testing.

## 2021-06-29 NOTE — ED Provider Notes (Signed)
Roderic Palau    CSN: [id number] Arrival date & time: 06/29/21  [id number]      History   Chief Complaint Chief Complaint  Patient presents with   Vaginal Discharge    HPI Wendy Aguilar is a 20 y.o. female.  Patient presents with 2-day history of white vaginal discharge.  She is sexually active with a new partner.  She requests STD testing.  She denies vaginal itching, abdominal pain, dysuria, hematuria, pelvic pain, flank pain, fever, rash, lesions, or other symptoms.  No treatments attempted at home.  The history is provided by the patient and medical records.   Past Medical History:  Diagnosis Date   Medical history non-contributory     Patient Active Problem List   Diagnosis Date Noted   'light-for-dates' infant with signs of fetal malnutrition 03/19/2018   False labor 03/12/2018   NST (non-stress test) reactive 03/12/2018   Encounter for fetal anatomic survey    Late prenatal care affecting pregnancy in second trimester    Supervision of other normal pregnancy, antepartum 10/21/2017   History of cesarean section 10/21/2017    Past Surgical History:  Procedure Laterality Date   CESAREAN SECTION N/A 10/10/2016   Procedure: CESAREAN SECTION;  Surgeon: Woodroe Mode, MD;  Location: [facility];  Service: Obstetrics;  Laterality: N/A;   CESAREAN SECTION N/A 03/19/2018   Procedure: CESAREAN SECTION;  Surgeon: Sloan Leiter, MD;  Location: [facility];  Service: Obstetrics;  Laterality: N/A;    OB History     Gravida  2   Para  2   Term  2   Preterm      AB      Living  2      SAB      IAB      Ectopic      Multiple  0   Live Births  2            Home Medications    Prior to Admission medications   Medication Sig Start Date End Date Taking? Authorizing Provider  fluticasone (FLONASE) 50 MCG/ACT nasal spray Place 1 spray into both nostrils 2 (two) times daily. 03/26/21   Volney American, PA-C  metroNIDAZOLE  (FLAGYL) 500 MG tablet Take 1 tablet (500 mg total) by mouth 2 (two) times daily. 05/27/21   Brunetta Jeans, PA-C  pseudoephedrine (SUDAFED) 30 MG tablet Take 1 tablet (30 mg total) by mouth every 8 (eight) hours as needed for congestion. 03/26/21   Volney American, PA-C    Family History Family History  Problem Relation Age of Onset   Alcohol abuse Neg Hx    Arthritis Neg Hx    Asthma Neg Hx    Birth defects Neg Hx    COPD Neg Hx    Depression Neg Hx    Diabetes Neg Hx    Drug abuse Neg Hx    Early death Neg Hx    Hearing loss Neg Hx    Heart disease Neg Hx    Hyperlipidemia Neg Hx    Hypertension Neg Hx    Kidney disease Neg Hx    Learning disabilities Neg Hx    Mental illness Neg Hx    Mental retardation Neg Hx    Miscarriages / Stillbirths Neg Hx    Stroke Neg Hx    Vision loss Neg Hx    Varicose Veins Neg Hx     Social History Social History   Tobacco  Use   Smoking status: Never   Smokeless tobacco: Never  Vaping Use   Vaping Use: Never used  Substance Use Topics   Alcohol use: No   Drug use: No     Allergies   Patient has no known allergies.   Review of Systems Review of Systems  Constitutional:  Negative for chills and fever.  Gastrointestinal:  Negative for abdominal pain and vomiting.  Genitourinary:  Positive for vaginal discharge. Negative for dysuria, flank pain, hematuria and pelvic pain.  Skin:  Negative for color change and rash.  All other systems reviewed and are negative.   Physical Exam Triage Vital Signs ED Triage Vitals [06/29/21 0837]  Enc Vitals Group     BP      Pulse Rate 70     Resp      Temp 98.7 F (37.1 C)     Temp Source Oral     SpO2 98 %     Weight      Height      Head Circumference      Peak Flow      Pain Score      Pain Loc      Pain Edu?      Excl. in GC?    No data found.  Updated Vital Signs Pulse 70   Temp 98.7 F (37.1 C) (Oral)   LMP 05/31/2021   SpO2 98%   Visual Acuity Right Eye  Distance:   Left Eye Distance:   Bilateral Distance:    Right Eye Near:   Left Eye Near:    Bilateral Near:     Physical Exam Vitals and nursing note reviewed.  Constitutional:      General: She is not in acute distress.    Appearance: She is well-developed. She is not ill-appearing.  HENT:     Mouth/Throat:     Mouth: Mucous membranes are moist.  Cardiovascular:     Rate and Rhythm: Normal rate and regular rhythm.     Heart sounds: Normal heart sounds.  Pulmonary:     Effort: Pulmonary effort is normal. No respiratory distress.     Breath sounds: Normal breath sounds.  Abdominal:     General: Bowel sounds are normal.     Palpations: Abdomen is soft.     Tenderness: There is no abdominal tenderness. There is no right CVA tenderness, left CVA tenderness, guarding or rebound.  Musculoskeletal:     Cervical back: Neck supple.  Skin:    General: Skin is warm and dry.  Neurological:     Mental Status: She is alert.  Psychiatric:        Mood and Affect: Mood normal.        Behavior: Behavior normal.     UC Treatments / Results  Labs (all labs ordered are listed, but only abnormal results are displayed) Labs Reviewed  POCT URINALYSIS DIP (MANUAL ENTRY) - Abnormal; Notable for the following components:      Result Value   Protein Ur, POC =30 (*)    All other components within normal limits  CERVICOVAGINAL ANCILLARY ONLY    EKG   Radiology No results found.  Procedures Procedures (including critical care time)  Medications Ordered in UC Medications - No data to display  Initial Impression / Assessment and Plan / UC Course  I have reviewed the triage vital signs and the nursing notes.  Pertinent labs & imaging results that were available during my care of the patient  were reviewed by me and considered in my medical decision making (see chart for details).   Vaginal discharge.  Patient obtained vaginal self swab for testing. Discussed with patient that she may  require treatment if her test results are positive.  Discussed that her sexual partner may also require treatment.  Instructed her to abstain from sexual activity for at least 7 days.  Instructed her to follow-up with her PCP or OB/GYN if her symptoms are not improving.  Patient agrees to plan of care.    Final Clinical Impressions(s) / UC Diagnoses   Final diagnoses:  Vaginal discharge     Discharge Instructions      Your vaginal tests are pending.  If your test results are positive, we will call you.  You and your sexual partner(s) may require treatment at that time.  Do not have sexual activity for at least 7 days.    Follow up with your primary care provider if your symptoms are not improving.        ED Prescriptions   None    PDMP not reviewed this encounter.   Sharion Balloon, NP 06/29/21 [phone]

## 2021-06-29 NOTE — Discharge Instructions (Addendum)
Your vaginal tests are pending.  If your test results are positive, we will call you.  You and your sexual partner(s) may require treatment at that time.  Do not have sexual activity for at least 7 days.    Follow up with your primary care provider if your symptoms are not improving.    

## 2021-06-30 ENCOUNTER — Telehealth (HOSPITAL_COMMUNITY): Payer: Self-pay | Admitting: Emergency Medicine

## 2021-06-30 LAB — CERVICOVAGINAL ANCILLARY ONLY
Bacterial Vaginitis (gardnerella): POSITIVE — AB
Candida Glabrata: NEGATIVE
Candida Vaginitis: NEGATIVE
Chlamydia: NEGATIVE
Comment: NEGATIVE
Comment: NEGATIVE
Comment: NEGATIVE
Comment: NEGATIVE
Comment: NEGATIVE
Comment: NORMAL
Neisseria Gonorrhea: NEGATIVE
Trichomonas: NEGATIVE

## 2021-06-30 MED ORDER — METRONIDAZOLE 500 MG PO TABS: 500.0000 mg | ORAL_TABLET | Freq: Two times a day (BID) | ORAL | 0 refills | Status: DC

## 2021-07-26 DIAGNOSIS — R109 Unspecified abdominal pain: Secondary | ICD-10-CM | POA: Insufficient documentation

## 2021-07-26 DIAGNOSIS — Z5321 Procedure and treatment not carried out due to patient leaving prior to being seen by health care provider: Secondary | ICD-10-CM | POA: Diagnosis not present

## 2021-07-27 ENCOUNTER — Other Ambulatory Visit: Payer: Self-pay

## 2021-07-27 ENCOUNTER — Encounter: Payer: Self-pay | Admitting: Emergency Medicine

## 2021-07-27 ENCOUNTER — Emergency Department
Admission: EM | Admit: 2021-07-27 | Discharge: 2021-07-27 | Disposition: A | Discharge status: Home / Self Care | Payer: BC Managed Care – PPO | Attending: Emergency Medicine | Admitting: Emergency Medicine

## 2021-07-27 DIAGNOSIS — R109 Unspecified abdominal pain: Secondary | ICD-10-CM | POA: Diagnosis not present

## 2021-07-27 LAB — COMPREHENSIVE METABOLIC PANEL
ALT: 13 U/L (ref 0–44)
AST: 16 U/L (ref 15–41)
Albumin: 3.9 g/dL (ref 3.5–5.0)
Alkaline Phosphatase: 43 U/L (ref 38–126)
Anion gap: 5 (ref 5–15)
BUN: 11 mg/dL (ref 6–20)
CO2: 24 mmol/L (ref 22–32)
Calcium: 9.2 mg/dL (ref 8.9–10.3)
Chloride: 102 mmol/L (ref 98–111)
Creatinine, Ser: 0.64 mg/dL (ref 0.44–1.00)
GFR, Estimated: >60 mL/min (ref >60)
Glucose, Bld: 91 mg/dL (ref 70–99)
Potassium: 3.3 mmol/L — ABNORMAL LOW (ref 3.5–5.1)
Sodium: 131 mmol/L — ABNORMAL LOW (ref 135–145)
Total Bilirubin: 0.6 mg/dL (ref 0.3–1.2)
Total Protein: 8 g/dL (ref 6.5–8.1)

## 2021-07-27 LAB — URINALYSIS, ROUTINE W REFLEX MICROSCOPIC
Bilirubin Urine: NEGATIVE
Glucose, UA: NEGATIVE mg/dL
Hgb urine dipstick: NEGATIVE
Ketones, ur: NEGATIVE mg/dL
Leukocytes,Ua: NEGATIVE
Nitrite: NEGATIVE
Specific Gravity, Urine: 1.025 (ref 1.005–1.030)
pH: 7 (ref 5.0–8.0)

## 2021-07-27 LAB — URINALYSIS, MICROSCOPIC (REFLEX): Bacteria, UA: NONE SEEN

## 2021-07-27 LAB — CBC
HCT: 36.8 % (ref 36.0–46.0)
Hemoglobin: 12.2 g/dL (ref 12.0–15.0)
MCH: 27.9 pg (ref 26.0–34.0)
MCHC: 33.2 g/dL (ref 30.0–36.0)
MCV: 84 fL (ref 80.0–100.0)
Platelets: 193 10*3/uL (ref 150–400)
RBC: 4.38 MIL/uL (ref 3.87–5.11)
RDW: 12.2 % (ref 11.5–15.5)
WBC: 8.7 10*3/uL (ref 4.0–10.5)
nRBC: 0 % (ref 0.0–0.2)

## 2021-07-27 LAB — POC URINE PREG, ED: Preg Test, Ur: POSITIVE — AB

## 2021-07-27 LAB — LIPASE, BLOOD: Lipase: 33 U/L (ref 11–51)

## 2021-07-27 MED ORDER — ACETAMINOPHEN 325 MG PO TABS
650.0000 mg | ORAL_TABLET | Freq: Once | ORAL | Status: AC
  Administered 2021-07-27: 01:00:00 650 mg via ORAL
  Filled 2021-07-27: qty 2

## 2021-07-27 NOTE — ED Triage Notes (Signed)
Pt present to ER with lower back pain and abdominal pain since this evening. Pt reports he took a pregnancy test and was positive. Pt denies any urinary symptoms. Pt talks in complete sentences no distress noted

## 2021-07-28 ENCOUNTER — Telehealth: Payer: Self-pay | Admitting: Family Medicine

## 2021-07-28 ENCOUNTER — Encounter: Payer: Self-pay | Admitting: Family Medicine

## 2021-07-28 NOTE — Telephone Encounter (Signed)
Call returned to pt. Spoke with pt. Pt states going to ED and having positive pregnancy test. Pt states has exact LMP 05-31-21. Pt denies any vaginal bleeding or abd pain at this time.  Pt advised to make New OB intake appt.  By LMP, pt today is 8wk 2d.  Pt scheduled for new OB Intake on 12/28 at 915am. Pt agreeable to date and time of appt.  Laney Pastor

## 2021-07-28 NOTE — Telephone Encounter (Signed)
Patient called stating she had done blood work to confirm pregnancy on 12/26 but wanted to schedule an ultrasound to confirm how far along she was due to her not knowing a for sure date. She had the dates of her last menstrual but still did not feel confident that those dates were reliable.

## 2021-07-29 ENCOUNTER — Telehealth (INDEPENDENT_AMBULATORY_CARE_PROVIDER_SITE_OTHER): Payer: BC Managed Care – PPO

## 2021-07-29 ENCOUNTER — Other Ambulatory Visit: Payer: Self-pay

## 2021-07-29 DIAGNOSIS — Z3A00 Weeks of gestation of pregnancy not specified: Secondary | ICD-10-CM

## 2021-07-29 DIAGNOSIS — O0990 Supervision of high risk pregnancy, unspecified, unspecified trimester: Secondary | ICD-10-CM

## 2021-07-29 DIAGNOSIS — Z3480 Encounter for supervision of other normal pregnancy, unspecified trimester: Secondary | ICD-10-CM | POA: Insufficient documentation

## 2021-07-29 DIAGNOSIS — O34219 Maternal care for unspecified type scar from previous cesarean delivery: Secondary | ICD-10-CM

## 2021-07-29 MED ORDER — PRENATAL PLUS 27-1 MG PO TABS: 1.0000 | ORAL_TABLET | Freq: Every day | ORAL | 11 refills | Status: DC

## 2021-07-29 NOTE — Progress Notes (Signed)
New OB Intake  I connected with  Wendy Aguilar on 07/29/21 at  9:15 AM EST by MyChart Video Visit and verified that I am speaking with the correct person using two identifiers. Nurse is located at [facility] and pt is located at home.  I discussed the limitations, risks, security and privacy concerns of performing an evaluation and management service by telephone and the availability of in person appointments. I also discussed with the patient that there may be a patient responsible charge related to this service. The patient expressed understanding and agreed to proceed.  I explained I am completing New OB Intake today. We discussed her EDD of 03/07/22 that is based on LMP of 05/31/21. Pt is G3/P2. I reviewed her allergies, medications, Medical/Surgical/OB history, and appropriate screenings. I informed her of [facility] services. Based on history, this is a/an  pregnancy complicated by Hx: c/s x 2  .   Patient Active Problem List   Diagnosis Date Noted   'light-for-dates' infant with signs of fetal malnutrition 03/19/2018   False labor 03/12/2018   NST (non-stress test) reactive 03/12/2018   Encounter for fetal anatomic survey    Late prenatal care affecting pregnancy in second trimester    Supervision of other normal pregnancy, antepartum 10/21/2017   History of cesarean section 10/21/2017    Concerns addressed today  Delivery Plans:  Plans to deliver at [facility].   MyChart/Babyscripts MyChart access verified. I explained pt will have some visits in office and some virtually. Babyscripts instructions given and order placed. Patient verifies receipt of registration text/e-mail. Account successfully created and app downloaded.  Blood Pressure Cuff  Patient has private insurance; instructed to purchase blood pressure cuff and bring to first prenatal appt. Explained after first prenatal appt pt will check weekly and document in Babyscripts.  Weight scale: Patient    have weight scale. Weight  scale ordered for patient to pick up form [facility].   Anatomy US Explained first scheduled [place] will be around 19 weeks. Anatomy US scheduled for 10/12/21 at 09:45a. Pt notified to arrive at 09:30a.  Labs Discussed Avelina Laine genetic screening with patient. Would like both Panorama and Horizon drawn at new OB visit. Routine prenatal labs needed.  Covid Vaccine Patient has covid vaccine.   Centering in Pregnancy Candidate?  If yes, offer as possibility  Mother/ Baby Dyad Candidate?    If yes, offer as possibility  Informed patient of [facility] website  and placed link in her AVS.   Social Determinants of Health Food Insecurity: Patient denies food insecurity. WIC Referral: Patient is interested in referral to [facility].  Transportation: Patient denies transportation needs. Childcare: Discussed no children allowed at ultrasound appointments. Offered childcare services; patient declines childcare services at this time.  Send link to Pregnancy Navigators   Placed OB Box on problem list and updated  First visit review I reviewed new OB appt with pt. I explained she will have a pelvic exam, ob bloodwork with genetic screening, and PAP smear. Explained pt will be seen by Dr Christy Gentles at first visit; encounter routed to appropriate provider. Explained that patient will be seen by pregnancy navigator following visit with provider. [facility] information placed in AVS.   Henrietta Dine, CMA 07/29/2021  9:22 AM

## 2021-07-29 NOTE — Patient Instructions (Signed)
  At our [facility], we work as an integrated team, providing care to address both physical and emotional health. Your medical provider may refer you to see our Behavioral Health Clinician (BHC) on the same day you see your medical provider, as availability permits; often scheduled virtually at your convenience.  Our BHC is available to all patients, visits generally last between 20-30 minutes, but can be longer or shorter, depending on patient need. The BHC offers help with stress management, coping with symptoms of depression and anxiety, major life changes , sleep issues, changing risky behavior, grief and loss, life stress, working on personal life goals, and  behavioral health issues, as these all affect your overall health and wellness.  The BHC is NOT available for the following: FMLA paperwork, court-ordered evaluations, specialty assessments (custody or disability), letters to employers, or obtaining certification for an emotional support animal. The BHC does not provide long-term therapy. You have the right to refuse integrated behavioral health services, or to reschedule to see the BHC at a later date.  Confidentiality exception: If it is suspected that a child or disabled adult is being abused or neglected, we are required by law to report that to either Child Protective Services or Adult Protective Services.  If you have a diagnosis of Bipolar affective disorder, Schizophrenia, or recurrent Major depressive disorder, we will recommend that you establish care with a psychiatrist, as these are lifelong, chronic conditions, and we want your overall emotional health and medications to be more closely monitored. If you anticipate needing extended maternity leave due to mental health issues postpartum, it it recommended you inform your medical provider, so we can put in a referral to a psychiatrist as soon as possible. The BHC is unable to recommend an extended maternity leave for mental  health issues. Your medical provider or BHC may refer you to a therapist for ongoing, traditional therapy, or to a psychiatrist, for medication management, if it would benefit your overall health. Depending on your insurance, you may have a copay or be charged a deductible, depending on your insurance, to see the BHC. If you are uninsured, it is recommended that you apply for financial assistance. (Forms may be requested at the front desk for in-person visits, via MyChart, or request a form during a virtual visit).  If you see the BHC more than 6 times, you will have to complete a comprehensive clinical assessment interview with the BHC to resume integrated services.  For virtual visits with the BHC, you must be physically in the state of [place] at the time of the visit. For example, if you live in [place], you will have to do an in-person visit with the BHC, and your out-of-state insurance may not cover behavioral health services in [place]. If you are going out of the state or country for any reason, the BHC may see you virtually when you return to [place], but not while you are physically outside of [place].    

## 2021-08-18 ENCOUNTER — Encounter: Payer: BC Managed Care – PPO | Admitting: Family Medicine

## 2021-08-18 DIAGNOSIS — Z3A11 11 weeks gestation of pregnancy: Secondary | ICD-10-CM

## 2021-08-18 DIAGNOSIS — O0990 Supervision of high risk pregnancy, unspecified, unspecified trimester: Secondary | ICD-10-CM

## 2021-09-04 ENCOUNTER — Telehealth: Payer: BC Managed Care – PPO | Admitting: Nurse Practitioner

## 2021-09-04 DIAGNOSIS — J0100 Acute maxillary sinusitis, unspecified: Secondary | ICD-10-CM | POA: Diagnosis not present

## 2021-09-04 DIAGNOSIS — K0889 Other specified disorders of teeth and supporting structures: Secondary | ICD-10-CM

## 2021-09-04 MED ORDER — NAPROXEN 500 MG PO TABS: 500.0000 mg | ORAL_TABLET | Freq: Two times a day (BID) | ORAL | 1 refills | Status: DC

## 2021-09-04 MED ORDER — CLINDAMYCIN HCL 300 MG PO CAPS: 300.0000 mg | ORAL_CAPSULE | Freq: Three times a day (TID) | ORAL | 0 refills | Status: DC

## 2021-09-04 NOTE — Progress Notes (Signed)
Virtual Visit Consent   Wendy Aguilar Rebman, you are scheduled for Aguilar virtual visit with Mary-Margaret Daphine Deutscher, FNP, [facility] provider, today.     Just as with appointments in the office, your consent must be obtained to participate.  Your consent will be active for this visit and any virtual visit you may have with one of our providers in the next 365 days.     If you have Aguilar MyChart account, Aguilar copy of this consent can be sent to you electronically.  All virtual visits are billed to your insurance company just like Aguilar traditional visit in the office.    As this is Aguilar virtual visit, video technology does not allow for your provider to perform Aguilar traditional examination.  This may limit your provider's ability to fully assess your condition.  If your provider identifies any concerns that need to be evaluated in person or the need to arrange testing (such as labs, EKG, etc.), we will make arrangements to do so.     Although advances in technology are sophisticated, we cannot ensure that it will always work on either your end or our end.  If the connection with Aguilar video visit is poor, the visit may have to be switched to Aguilar telephone visit.  With either Aguilar video or telephone visit, we are not always able to ensure that we have Aguilar secure connection.     I need to obtain your verbal consent now.   Are you willing to proceed with your visit today? YES   Wendy Aguilar Notarianni has provided verbal consent on 09/04/2021 for Aguilar virtual visit (video or telephone).   Mary-Margaret Daphine Deutscher, FNP   Date: 09/04/2021 5:58 PM   Virtual Visit via Video Note   I, Mary-Margaret Daphine Deutscher, connected with Wendy Aguilar ([id number], 2001-03-31) on 09/04/21 at  6:00 PM EST by Aguilar video-enabled telemedicine application and verified that I am speaking with the correct person using two identifiers.  Location: Patient: Virtual Visit Location Patient: Home Provider: Virtual Visit Location Provider: Mobile   I discussed the  limitations of evaluation and management by telemedicine and the availability of in person appointments. The patient expressed understanding and agreed to proceed.    History of Present Illness: Wendy Aguilar is Aguilar 21 y.o. who identifies as Aguilar female who was assigned female at birth, and is being seen today for dental pain.  HPI: Dental Pain  This is Aguilar new problem. The current episode started today. The problem occurs constantly. The problem has been gradually worsening. The pain is at Aguilar severity of 7/10. The pain is moderate. Associated symptoms include facial pain, Aguilar fever and sinus pressure. She has tried NSAIDs (robitussin, dayquil and nyquil) for the symptoms. The treatment provided mild relief.   Review of Systems  Constitutional:  Positive for fever.  HENT:  Positive for sinus pressure.    Problems:  Patient Active Problem List   Diagnosis Date Noted   Supervision of high risk pregnancy, antepartum 07/29/2021   History of cesarean section 10/21/2017    Allergies: No Known Allergies Medications:  Current Outpatient Medications:    prenatal vitamin w/FE, FA (PRENATAL 1 + 1) 27-1 MG TABS tablet, Take 1 tablet by mouth daily at 12 noon., Disp: 30 tablet, Rfl: 11  Observations/Objective: Patient is well-developed, well-nourished in no acute distress.  Resting comfortably  at home.  Head is normocephalic, atraumatic.  No labored breathing.  Speech is clear and coherent with logical content.  Patient is alert and oriented at baseline.  Hoarse voice\ Dry cough Upper left dental pain  Assessment and Plan: Wendy Aguilar [place] in today with chief complaint of Dental Pain   1. Pain, dental 2. Acute non-recurrent maxillary sinusitis Force fluids Gargle with warm salt water Meds ordered this encounter  Medications   clindamycin (CLEOCIN) 300 MG capsule    Sig: Take 1 capsule (300 mg total) by mouth 3 (three) times daily.    Dispense:  30 capsule    Refill:  0    Order Specific  Question:   Supervising Provider    Answer:   Eber Hong [[id number]]   DISCONTD: naproxen (NAPROSYN) 500 MG tablet    Sig: Take 1 tablet (500 mg total) by mouth 2 (two) times daily with Aguilar meal.    Dispense:  60 tablet    Refill:  1    Order Specific Question:   Supervising Provider    Answer:   Eber Hong [[id number]]       The above assessment and management plan was discussed with the patient. The patient verbalized understanding of and has agreed to the management plan. Patient is aware to call the clinic if symptoms persist or worsen. Patient is aware when to return to the clinic for Aguilar follow-up visit. Patient educated on when it is appropriate to go to the emergency department.   Mary-Margaret Daphine Deutscher, FNP    Follow Up Instructions: I discussed the assessment and treatment plan with the patient. The patient was provided an opportunity to ask questions and all were answered. The patient agreed with the plan and demonstrated an understanding of the instructions.  Aguilar copy of instructions were sent to the patient via MyChart.  The patient was advised to call back or seek an in-person evaluation if the symptoms worsen or if the condition fails to improve as anticipated.  Time:  I spent 8  minutes with the patient via telehealth technology discussing the above problems/concerns.    Mary-Margaret Daphine Deutscher, FNP

## 2021-09-04 NOTE — Patient Instructions (Signed)
Dental Pain Dental pain is often a sign that something is wrong with your teeth or gums. It is also something that can occur following dental treatment. If you have dental pain, it is important to contact your dental care provider, especially if the cause of the pain has not been determined. Dental pain may be of varying intensity and can be caused by many things, including: Tooth decay (cavities or caries). Cavities are caused by bacteria that produce acids that irritate the nerve of your tooth, making it sensitive to air and hot or cold temperatures. This eventually causes discomfort or pain. Abscess or infection. Once the bacteria reach the inner part of the tooth (pulp), a bacterial infection (dental abscess) can occur. Pus typically collects at the end of the root of a tooth. Injury. A crack in the tooth. Gum recession exposing the root, and possibly the nerves, of a tooth. Gum (periodontal)disease. Abnormal grinding or clenching. Poor or improper home care. An unknown reason (idiopathic). Your pain may be mild or severe. It may occur when you are: Chewing. Exposed to hot or cold temperatures. Eating or drinking sugary foods or beverages, such as soda or candy. Your pain may be constant, or it may come and go without cause. Follow these instructions at home: The following actions may help to lessen any discomfort that you are feeling before or after getting dental care. Medicines Take over-the-counter and prescription medicines only as told by your dental care provider. If you were prescribed an antibiotic medicine, take it as told by your dental care provider. Do not stop taking the antibiotic even if you start to feel better. Eating and drinking Avoid foods or drinks that cause you pain, such as: Very hot or very cold foods or drinks. Sweet or sugary foods or drinks. Managing pain and swelling  Ice can sometimes be used to reduce pain and swelling, especially if the pain is  following dental treatment. If directed, put ice on the painful area of your face. To do this: Put ice in a plastic bag. Place a towel between your skin and the bag. Leave the ice on for 20 minutes, 2-3 times a day. Remove the ice if your skin turns bright red. This is very important. If you cannot feel pain, heat, or cold, you have a greater risk of damage to the area. Brushing your teeth To keep your mouth and gums healthy, brush your teeth twice a day using a fluoride toothpaste. Use a toothpaste made for sensitive teeth as directed by your dental care provider, especially if the root is exposed. Always brush your teeth with a soft-bristled toothbrush. This will help prevent irritation to your gums. General instructions Floss at least once a day. Do not apply heat to the outside of the face. Gargle with a mixture of salt and water 3-4 times a day or as needed. To make salt water, completely dissolve -1 tsp (3-6 g) of salt in 1 cup (237 mL) of warm water. Keep all follow-up visits. This is important. Contact a dental care provider if: You have any unexplained dental pain. Your pain is not controlled with medicines. Your symptoms get worse. You have new symptoms. Get help right away if: You are unable to open your mouth. You are having trouble breathing or swallowing. You have a fever. You notice that your face, neck, or jaw is swollen. These symptoms may represent a serious problem that is an emergency. Do not wait to see if the symptoms will go   away. Get medical help right away. Call your local emergency services (911 in the [place] Do not drive yourself to the hospital. Summary Dental pain may be caused by many things, including tooth decay and infection. Your pain may be mild or severe. Take over-the-counter and prescription medicines only as told by your dental care provider. Watch your dental pain for any changes. Let your dental care provider know if your symptoms get  worse. This information is not intended to replace advice given to you by your health care provider. Make sure you discuss any questions you have with your health care provider. Document Revised: 04/23/2020 Document Reviewed: 04/23/2020 Elsevier Patient Education  2022 Elsevier Inc.  

## 2021-10-12 ENCOUNTER — Ambulatory Visit: Payer: BC Managed Care – PPO | Attending: Family Medicine

## 2021-10-12 ENCOUNTER — Ambulatory Visit: Payer: BC Managed Care – PPO | Admitting: *Deleted

## 2021-10-12 ENCOUNTER — Other Ambulatory Visit: Payer: Self-pay

## 2021-10-12 ENCOUNTER — Other Ambulatory Visit: Payer: Self-pay | Admitting: *Deleted

## 2021-10-12 VITALS — BP 126/66 | HR 103

## 2021-10-12 DIAGNOSIS — O0990 Supervision of high risk pregnancy, unspecified, unspecified trimester: Secondary | ICD-10-CM | POA: Diagnosis not present

## 2021-10-12 DIAGNOSIS — Z362 Encounter for other antenatal screening follow-up: Secondary | ICD-10-CM

## 2021-10-12 DIAGNOSIS — O34219 Maternal care for unspecified type scar from previous cesarean delivery: Secondary | ICD-10-CM

## 2021-10-14 ENCOUNTER — Other Ambulatory Visit: Payer: Self-pay

## 2021-10-14 ENCOUNTER — Other Ambulatory Visit (HOSPITAL_COMMUNITY)
Admission: RE | Admit: 2021-10-14 | Discharge: 2021-10-14 | Disposition: A | Discharge status: Home / Self Care | Payer: BC Managed Care – PPO | Source: Ambulatory Visit | Attending: Family Medicine | Admitting: Family Medicine

## 2021-10-14 ENCOUNTER — Ambulatory Visit (INDEPENDENT_AMBULATORY_CARE_PROVIDER_SITE_OTHER): Payer: BC Managed Care – PPO | Admitting: Family Medicine

## 2021-10-14 VITALS — BP 124/66 | HR 85 | Wt 180.9 lb

## 2021-10-14 DIAGNOSIS — Z3A17 17 weeks gestation of pregnancy: Secondary | ICD-10-CM

## 2021-10-14 DIAGNOSIS — O283 Abnormal ultrasonic finding on antenatal screening of mother: Secondary | ICD-10-CM | POA: Diagnosis not present

## 2021-10-14 DIAGNOSIS — O0990 Supervision of high risk pregnancy, unspecified, unspecified trimester: Secondary | ICD-10-CM | POA: Insufficient documentation

## 2021-10-14 DIAGNOSIS — Z98891 History of uterine scar from previous surgery: Secondary | ICD-10-CM | POA: Diagnosis not present

## 2021-10-15 LAB — AFP, SERUM, OPEN SPINA BIFIDA
AFP MoM: 1.71
AFP Value: 69.2 ng/mL
Gest. Age on Collection Date: 17.4 weeks
Maternal Age At EDD: 22.7 yr
OSBR Risk 1 IN: 3181
Test Results:: NEGATIVE
Weight: 181 [lb_av]

## 2021-10-15 LAB — CBC/D/PLT+RPR+RH+ABO+RUBIGG...
Antibody Screen: NEGATIVE
Basophils Absolute: 0 10*3/uL (ref 0.0–0.2)
Basos: 0 %
EOS (ABSOLUTE): 0.1 10*3/uL (ref 0.0–0.4)
Eos: 1 %
HCV Ab: NONREACTIVE
HIV Screen 4th Generation wRfx: NONREACTIVE
Hematocrit: 34.9 % (ref 34.0–46.6)
Hemoglobin: 12 g/dL (ref 11.1–15.9)
Hepatitis B Surface Ag: NEGATIVE
Immature Grans (Abs): 0 10*3/uL (ref 0.0–0.1)
Immature Granulocytes: 0 %
Lymphocytes Absolute: 2.5 10*3/uL (ref 0.7–3.1)
Lymphs: 29 %
MCH: 28.4 pg (ref 26.6–33.0)
MCHC: 34.4 g/dL (ref 31.5–35.7)
MCV: 83 fL (ref 79–97)
Monocytes Absolute: 0.5 10*3/uL (ref 0.1–0.9)
Monocytes: 6 %
Neutrophils Absolute: 5.6 10*3/uL (ref 1.4–7.0)
Neutrophils: 64 %
Platelets: 188 10*3/uL (ref 150–450)
RBC: 4.23 x10E6/uL (ref 3.77–5.28)
RDW: 12.2 % (ref 11.7–15.4)
RPR Ser Ql: NONREACTIVE
Rh Factor: POSITIVE
Rubella Antibodies, IGG: 2.06 index (ref >0.99)
WBC: 8.8 10*3/uL (ref 3.4–10.8)

## 2021-10-15 LAB — HEMOGLOBIN A1C
Est. average glucose Bld gHb Est-mCnc: 97 mg/dL
Hgb A1c MFr Bld: 5 % (ref 4.8–5.6)

## 2021-10-15 LAB — HCV INTERPRETATION

## 2021-10-16 ENCOUNTER — Encounter: Payer: Self-pay | Admitting: *Deleted

## 2021-10-16 ENCOUNTER — Encounter: Payer: Self-pay | Admitting: Family Medicine

## 2021-10-16 DIAGNOSIS — O283 Abnormal ultrasonic finding on antenatal screening of mother: Secondary | ICD-10-CM | POA: Insufficient documentation

## 2021-10-16 LAB — GC/CHLAMYDIA PROBE AMP (~~LOC~~) NOT AT ARMC
Chlamydia: NEGATIVE
Comment: NEGATIVE
Comment: NORMAL
Neisseria Gonorrhea: NEGATIVE

## 2021-10-16 LAB — CULTURE, OB URINE

## 2021-10-16 LAB — URINE CULTURE, OB REFLEX: Organism ID, Bacteria: NO GROWTH

## 2021-10-16 NOTE — Progress Notes (Signed)
? ?History:  ? Wendy Aguilar is a 21 y.o. G3P2002 at [redacted]w[redacted]d by 17 week [place] being seen today for her first obstetrical visit.  Her obstetrical history is significant for 2 previous C-sections. Patient does intend to breast feed. Pregnancy history fully reviewed. ? ?Patient reports no complaints.  However, she recently had an ultrasound showing an absent nasal bone.  She is extremely worried on what this means. ? ?For C-section was due to nonreassuring fetal heart tones.  She attempted VBAC, but ended up needing repeat C-section due to NRFHT once again.  She would like to proceed with repeat C-section.  States she was put under general anesthesia for both due to anxiety. ?  ?  ?HISTORY: ?OB History  ?Gravida Para Term Preterm AB Living  ?3 2 2  0 0 2  ?SAB IAB Ectopic Multiple Live Births  ?0 0 0 0 2  ?  ?# Outcome Date GA Lbr Len/2nd Weight Sex Delivery Anes PTL Lv  ?3 Current           ?2 Term 03/20/18 [redacted]w[redacted]d  7 lb 5.8 oz (3.34 kg) M CS-LTranv Gen  LIV  ?   Name: Evora,BOY [redacted]w[redacted]d  ?   Apgar1: 1  Apgar5: 8  ?1 Term 10/10/16 [redacted]w[redacted]d  7 lb 5.3 oz (3.325 kg) F CS-Vac Gen  LIV  ?   Name: Levario,GIRL [redacted]w[redacted]d  ?   Apgar1: 8  Apgar5: 9  ?  ? ?Past Medical History:  ?Diagnosis Date  ? Medical history non-contributory   ? ?Past Surgical History:  ?Procedure Laterality Date  ? CESAREAN SECTION N/A 10/10/2016  ? Procedure: CESAREAN SECTION;  Surgeon: 12/10/2016, MD;  Location: [facility] BIRTHING SUITES;  Service: Obstetrics;  Laterality: N/A;  ? CESAREAN SECTION N/A 03/19/2018  ? Procedure: CESAREAN SECTION;  Surgeon: 03/21/2018, MD;  Location: [facility] BIRTHING SUITES;  Service: Obstetrics;  Laterality: N/A;  ? ?Family History  ?Problem Relation Age of Onset  ? Hypertension Mother   ? Alcohol abuse Neg Hx   ? Arthritis Neg Hx   ? Asthma Neg Hx   ? Birth defects Neg Hx   ? COPD Neg Hx   ? Depression Neg Hx   ? Diabetes Neg Hx   ? Drug abuse Neg Hx   ? Early death Neg Hx   ? Hearing loss Neg Hx   ? Heart disease Neg Hx   ?  Hyperlipidemia Neg Hx   ? Kidney disease Neg Hx   ? Learning disabilities Neg Hx   ? Mental illness Neg Hx   ? Mental retardation Neg Hx   ? Miscarriages / Stillbirths Neg Hx   ? Stroke Neg Hx   ? Vision loss Neg Hx   ? Varicose Veins Neg Hx   ? ?Social History  ? ?Tobacco Use  ? Smoking status: Never  ? Smokeless tobacco: Never  ?Vaping Use  ? Vaping Use: Never used  ?Substance Use Topics  ? Alcohol use: Not Currently  ?  Comment: not while preg  ? Drug use: No  ? ?No Known Allergies ?Current Outpatient Medications on File Prior to Visit  ?Medication Sig Dispense Refill  ? prenatal vitamin w/FE, FA (PRENATAL 1 + 1) 27-1 MG TABS tablet Take 1 tablet by mouth daily at 12 noon. 30 tablet 11  ? ?No current facility-administered medications on file prior to visit.  ? ? ?Review of Systems ?Pertinent items noted in HPI and remainder of comprehensive ROS otherwise negative. ?Physical  Exam:  ? ?Vitals:  ? 10/14/21 1526  ?BP: 124/66  ?Pulse: 85  ?Weight: 180 lb 14.4 oz (82.1 kg)  ? ?Fetal Heart Rate (bpm): 158 ? ?Constitutional: Well-developed, well-nourished pregnant female in no acute distress.  ?HEENT: PERRLA ?Skin: normal color and turgor, no rash ?Cardiovascular: normal rate  ?Respiratory: normal effort ?GI: Abd soft, non-tender, gravid appropriate for gestational age ?MS: Extremities nontender, no edema, normal ROM ?Neurologic: Alert and oriented x 4.  ?Pelvic: NEFG, physiologic discharge, cervix clean.  Cervix friable with Pap collection.  Pap/swabs collected ? ?Assessment:  ?  ?Pregnancy: [id number] ?Patient Active Problem List  ? Diagnosis Date Noted  ? Abnormal fetal ultrasound 10/16/2021  ? Supervision of high risk pregnancy, antepartum 07/29/2021  ? History of cesarean section 10/21/2017  ? ?  ?Plan:  ?  ?1. Supervision of high risk pregnancy, antepartum ?Doing well. + FHT.  ?- Hemoglobin A1c ?- Genetic Screening collected  ?- Culture, OB Urine ?- GC/Chlamydia probe amp ([place])not at [facility] ?-  CBC/D/Plt+RPR+Rh+ABO+RubIgG... ?- Cytology - PAP( [place]) ?- AFP, Serum, Open Spina Bifida ? ?2. [redacted] weeks gestation of pregnancy ? ?3. Abnormal prenatal ultrasound ?Absent nasal bone on U/S.  Spent majority of visit discussing what this means.  Genetic testing collected today.  Repeat U/S in 4 weeks. ? ?4. History of C-section ?2 previous C-sections, planning for repeat.  Discussed this would usually be around 39 weeks. ? ?Initial labs drawn. ?Continue prenatal vitamins. ?Problem list reviewed and updated. ?Genetic Screening discussed and collected.  ?Ultrasound discussed; fetal anatomic survey: follow up already scheduled.  ?Anticipatory guidance about prenatal visits given including labs, ultrasounds, and testing. ?Discussed usage of Babyscripts and virtual visits as additional source of managing and completing prenatal visits in midst of coronavirus and pandemic.   ?Encouraged to complete MyChart Registration for her ability to review results, send requests, and have questions addressed.  ?The nature of [facility] with multiple MDs and Advanced Practice Providers was explained to patient; also emphasized that residents, students are part of our team. ?Routine obstetric precautions reviewed. Encouraged to seek out care at office or emergency room [facility] preferred) for urgent and/or emergent concerns. ? ?Return in about 4 weeks (around 11/11/2021) for ROB .  ?  ? ?Leticia Penna, DO ?Ob Fellow  ? ?10/16/2021  10:25 PM ? ? ?  ? ? ?

## 2021-10-20 LAB — CYTOLOGY - PAP
Comment: NEGATIVE
Diagnosis: UNDETERMINED — AB
High risk HPV: NEGATIVE

## 2021-11-04 ENCOUNTER — Encounter: Payer: Self-pay | Admitting: Obstetrics & Gynecology

## 2021-11-04 ENCOUNTER — Ambulatory Visit (INDEPENDENT_AMBULATORY_CARE_PROVIDER_SITE_OTHER): Payer: BC Managed Care – PPO | Admitting: Obstetrics & Gynecology

## 2021-11-04 VITALS — BP 111/65 | HR 77 | Wt 183.0 lb

## 2021-11-04 DIAGNOSIS — Z3480 Encounter for supervision of other normal pregnancy, unspecified trimester: Secondary | ICD-10-CM

## 2021-11-04 DIAGNOSIS — Z98891 History of uterine scar from previous surgery: Secondary | ICD-10-CM

## 2021-11-04 DIAGNOSIS — Z3A20 20 weeks gestation of pregnancy: Secondary | ICD-10-CM

## 2021-11-04 DIAGNOSIS — O283 Abnormal ultrasonic finding on antenatal screening of mother: Secondary | ICD-10-CM

## 2021-11-04 NOTE — Progress Notes (Signed)
? ?PRENATAL VISIT NOTE ? ?Subjective:  ?Wendy Aguilar is a 21 y.o. G3P2002 at [redacted]w[redacted]d being seen today for ongoing prenatal care.  She is currently monitored for the following issues for this low-risk pregnancy and has History of cesarean section x 2; Supervision of other normal pregnancy, antepartum; and Abnormal fetal ultrasound on their problem list. ? ?Patient reports no complaints.  Contractions: Not present. Vag. Bleeding: None.  Movement: Present. Denies leaking of fluid.  ? ?The following portions of the patient's history were reviewed and updated as appropriate: allergies, current medications, past family history, past medical history, past social history, past surgical history and problem list.  ? ?Objective:  ? ?Vitals:  ? 11/04/21 1027  ?BP: 111/65  ?Pulse: 77  ?Weight: 183 lb (83 kg)  ? ? ?Fetal Status: Fetal Heart Rate (bpm): 165   Movement: Present    ? ?General:  Alert, oriented and cooperative. Patient is in no acute distress.  ?Skin: Skin is warm and dry. No rash noted.   ?Cardiovascular: Normal heart rate noted  ?Respiratory: Normal respiratory effort, no problems with respiration noted  ?Abdomen: Soft, gravid, appropriate for gestational age.  Pain/Pressure: Absent     ?Pelvic: Cervical exam deferred        ?Extremities: Normal range of motion.  Edema: None  ?Mental Status: Normal mood and affect. Normal behavior. Normal judgment and thought content.  ? ?Imaging: ?[place] MFM OB DETAIL +14 WK ? ?Result Date: 10/12/2021 ?----------------------------------------------------------------------  OBSTETRICS REPORT                       (Signed Final 10/12/2021 11:12 am) ---------------------------------------------------------------------- Patient Info  ID #:       [id number]                          D.O.B.:  June 09, 2001 (20 yrs)  Name:       Wendy Aguilar               Visit Date: 10/12/2021 10:34 am ---------------------------------------------------------------------- Performed By  Attending:         Tama High MD        Ref. Address:     [address]                                                             [place], [place]  Performed By:     Benson Norway          [facility]  Referred By:      [facility] ---------------------------------------------------------------------- Orders  #  Description                           Code        Ordered By  1  [place] MFM OB DETAIL +14 [place]               [id number]    [employer] ----------------------------------------------------------------------  #  Order #                     Accession #                Episode #  1  [id number]                   [id number]                 [id number] ---------------------------------------------------------------------- Indications  Abnormal fetal ultrasound (absent nasal        O28.9  bone)  [redacted] weeks gestation of pregnancy                Z3A.17  Encounter for antenatal screening for          Z36.3  malformations  Previous cesarean delivery, antepartum (x2)    O34.219 ---------------------------------------------------------------------- Fetal Evaluation  Num Of Fetuses:         1  Fetal Heart Rate(bpm):  157  Cardiac Activity:       Observed  Presentation:           Cephalic  Placenta:               Posterior  P. Cord Insertion:      Visualized, central  Amniotic Fluid  AFI FV:      Within normal limits                              Largest Pocket(cm)                              3 ---------------------------------------------------------------------- Biometry  BPD:      37.5  mm     G. Age:  17w 3d         58  %    CI:        77.14   %    70 - 86                                                           FL/HC:      16.9   %    14.6 - 17.6  HC:  135.2  mm     G. Age:  17w 0d         26  %    HC/AC:      1.12        1.07 - 1.29  AC:      120.6  mm     G. Age:  17w 5d         65  %    FL/BPD:     60.8   %  FL:       22.8  mm     G. Age:  16w 6d         28  %    FL/AC:      18.9   %    20 - 24  HUM:      22.6  mm     G. Age:  17w 0d         47  %  CER:      16.4  mm     G. Age:  16w 5d         20  %  NFT:       4.2  mm  LV:        8.1  mm  CM:        2.5  mm  Est. FW:     189  gm      0 lb 7 oz     45  % ---------------------------------------------------------------------- OB History  Gravidity:    3         Term:   2  Living:       2 ---------------------------------------------------------------------- Gestational Age  LMP:           19w 1d        Date:  05/31/21                 EDD:   03/07/22  U/S Today:     17w 2d                                        EDD:   03/20/22  Best:          17w 2d     Det. By:  U/S (10/12/21)           EDD:   03/20/22 ---------------------------------------------------------------------- Anatomy  Cranium:               Appears normal         LVOT:                   Appears normal  Cavum:                 Not well visualized    Aortic Arch:            Appears normal  Ventricles:            Appears normal         Ductal Arch:            Appears normal  Choroid Plexus:        Appears normal         Diaphragm:              Appears normal  Cerebellum:            Appears normal  Stomach:                Appears normal, left                                                                        sided  Posterior Fossa:       Appears normal         Abdomen:                Appears normal  Nuchal Fold:           Appears normal         Abdominal Wall:         Appears nml (cord                                                                        insert, abd wall)  Face:                  Absent nasal bone      Cord Vessels:           Appears normal (3                                                                         vessel cord)  Lips:                  Not well visualized    Kidneys:                Appear normal  Palate:                Not well visualized    Bladder:                Appears normalh  Thoracic:              Appears normal         Spine:                  Ltd views no                                                                        intracranial signs of  NTD  Heart:                 Appears normal         Upper Extremities:      Appears normal                         (4CH, axis, and                         situs)  RVOT:                  Appears normal         Lower Extremities:      Visualized  Other:  Fetus appears to be female. Heels/feet and open hands/5th digits          visualized. VC, 3VV visualized. Technically difficult due to early          gestational age. ---------------------------------------------------------------------- Cervix Uterus Adnexa  Cervix  Length:           3.42  cm.  Normal appearance by transabdominal scan.  Right Ovary  Not visualized.  Left Ovary  Not visualized.  Adnexa  No abnormality visualized. ---------------------------------------------------------------------- Impression  G3 P2. Patient is here for fetal anatomy scan.  She is unsure  of her LMP date.  Obstetrical history significant for 2 term cesarean deliveries.  Patient has not had screening for fetal aneuploidies and is  yet to have her prenatal blood work.  We performed a fetal anatomical survey.  Fetal biometry is  consistent with 17 weeks and 2 days gestation.  Amniotic  fluid is normal and good fetal activity seen.  Nasal bone is  absent.  No other markers of aneuploidy's or fetal structural  defects are seen.  Placenta is posterior and there is no evidence of previa or  placenta accreta spectrum.  I explained that absent nasal bone is a marker for Down  syndrome.  It is slightly more common in African-American   populations.  I discussed the significance and limitations of  cell free fetal DNA screening.  I have recommended  screening.  Patient has a prenatal visit appointment on 10/14/2021 and  would like to hav

## 2021-11-09 ENCOUNTER — Ambulatory Visit: Payer: BC Managed Care – PPO | Attending: Obstetrics and Gynecology

## 2021-11-09 ENCOUNTER — Other Ambulatory Visit: Payer: Self-pay | Admitting: *Deleted

## 2021-11-09 ENCOUNTER — Ambulatory Visit: Payer: BC Managed Care – PPO | Admitting: *Deleted

## 2021-11-09 ENCOUNTER — Encounter: Payer: Self-pay | Admitting: *Deleted

## 2021-11-09 VITALS — BP 137/69 | HR 98

## 2021-11-09 DIAGNOSIS — O34219 Maternal care for unspecified type scar from previous cesarean delivery: Secondary | ICD-10-CM

## 2021-11-09 DIAGNOSIS — Z289 Immunization not carried out for unspecified reason: Secondary | ICD-10-CM | POA: Insufficient documentation

## 2021-11-09 DIAGNOSIS — O35AXX0 Maternal care for other (suspected) fetal abnormality and damage, fetal facial anomalies, not applicable or unspecified: Secondary | ICD-10-CM | POA: Diagnosis not present

## 2021-11-09 DIAGNOSIS — Z3480 Encounter for supervision of other normal pregnancy, unspecified trimester: Secondary | ICD-10-CM | POA: Insufficient documentation

## 2021-11-09 DIAGNOSIS — Q758 Other specified congenital malformations of skull and face bones: Secondary | ICD-10-CM

## 2021-11-09 DIAGNOSIS — Z3A21 21 weeks gestation of pregnancy: Secondary | ICD-10-CM | POA: Insufficient documentation

## 2021-11-09 DIAGNOSIS — Z362 Encounter for other antenatal screening follow-up: Secondary | ICD-10-CM | POA: Insufficient documentation

## 2021-11-28 ENCOUNTER — Emergency Department
Admission: EM | Admit: 2021-11-28 | Discharge: 2021-11-28 | Discharge status: Against Medical Advice | Payer: BC Managed Care – PPO | Source: Home / Self Care

## 2021-11-28 ENCOUNTER — Other Ambulatory Visit: Payer: Self-pay

## 2021-11-28 ENCOUNTER — Inpatient Hospital Stay
Admission: EM | Admit: 2021-11-28 | Discharge: 2021-11-28 | Disposition: A | Discharge status: Home / Self Care | Payer: BC Managed Care – PPO | Attending: Obstetrics and Gynecology | Admitting: Obstetrics and Gynecology

## 2021-11-28 ENCOUNTER — Encounter: Payer: Self-pay | Admitting: Obstetrics and Gynecology

## 2021-11-28 DIAGNOSIS — O26892 Other specified pregnancy related conditions, second trimester: Secondary | ICD-10-CM | POA: Diagnosis not present

## 2021-11-28 DIAGNOSIS — O34219 Maternal care for unspecified type scar from previous cesarean delivery: Secondary | ICD-10-CM | POA: Insufficient documentation

## 2021-11-28 DIAGNOSIS — D563 Thalassemia minor: Secondary | ICD-10-CM | POA: Diagnosis not present

## 2021-11-28 DIAGNOSIS — O99891 Other specified diseases and conditions complicating pregnancy: Secondary | ICD-10-CM | POA: Diagnosis not present

## 2021-11-28 DIAGNOSIS — Z3480 Encounter for supervision of other normal pregnancy, unspecified trimester: Secondary | ICD-10-CM

## 2021-11-28 DIAGNOSIS — R519 Headache, unspecified: Secondary | ICD-10-CM | POA: Diagnosis not present

## 2021-11-28 DIAGNOSIS — Z3A24 24 weeks gestation of pregnancy: Secondary | ICD-10-CM | POA: Insufficient documentation

## 2021-11-28 DIAGNOSIS — O0992 Supervision of high risk pregnancy, unspecified, second trimester: Secondary | ICD-10-CM | POA: Insufficient documentation

## 2021-11-28 DIAGNOSIS — N858 Other specified noninflammatory disorders of uterus: Secondary | ICD-10-CM | POA: Insufficient documentation

## 2021-11-28 DIAGNOSIS — Z3689 Encounter for other specified antenatal screening: Secondary | ICD-10-CM | POA: Diagnosis not present

## 2021-11-28 DIAGNOSIS — O09893 Supervision of other high risk pregnancies, third trimester: Secondary | ICD-10-CM | POA: Diagnosis not present

## 2021-11-28 LAB — COMPREHENSIVE METABOLIC PANEL
ALT: 12 U/L (ref 0–44)
AST: 15 U/L (ref 15–41)
Albumin: 3 g/dL — ABNORMAL LOW (ref 3.5–5.0)
Alkaline Phosphatase: 47 U/L (ref 38–126)
Anion gap: 3 — ABNORMAL LOW (ref 5–15)
BUN: 7 mg/dL (ref 6–20)
CO2: 23 mmol/L (ref 22–32)
Calcium: 8.4 mg/dL — ABNORMAL LOW (ref 8.9–10.3)
Chloride: 109 mmol/L (ref 98–111)
Creatinine, Ser: 0.58 mg/dL (ref 0.44–1.00)
GFR, Estimated: >60 mL/min (ref >60)
Glucose, Bld: 79 mg/dL (ref 70–99)
Potassium: 3.5 mmol/L (ref 3.5–5.1)
Sodium: 135 mmol/L (ref 135–145)
Total Bilirubin: 0.6 mg/dL (ref 0.3–1.2)
Total Protein: 6.9 g/dL (ref 6.5–8.1)

## 2021-11-28 LAB — CBC
HCT: 33.7 % — ABNORMAL LOW (ref 36.0–46.0)
Hemoglobin: 11.1 g/dL — ABNORMAL LOW (ref 12.0–15.0)
MCH: 27.9 pg (ref 26.0–34.0)
MCHC: 32.9 g/dL (ref 30.0–36.0)
MCV: 84.7 fL (ref 80.0–100.0)
Platelets: 174 10*3/uL (ref 150–400)
RBC: 3.98 MIL/uL (ref 3.87–5.11)
RDW: 13.4 % (ref 11.5–15.5)
WBC: 8.7 10*3/uL (ref 4.0–10.5)
nRBC: 0 % (ref 0.0–0.2)

## 2021-11-28 LAB — PROTEIN / CREATININE RATIO, URINE
Creatinine, Urine: 245 mg/dL
Protein Creatinine Ratio: 0.08 mg/mg{Cre} (ref 0.00–0.15)
Total Protein, Urine: 20 mg/dL

## 2021-11-28 MED ORDER — DIPHENHYDRAMINE HCL 25 MG PO CAPS: 25.0000 mg | ORAL_CAPSULE | Freq: Four times a day (QID) | ORAL | Status: DC | PRN

## 2021-11-28 MED ORDER — PRENATAL MULTIVITAMIN CH: 1.0000 | ORAL_TABLET | Freq: Every day | ORAL | Status: DC

## 2021-11-28 MED ORDER — ACETAMINOPHEN 325 MG PO TABS: 650.0000 mg | ORAL_TABLET | ORAL | Status: DC | PRN

## 2021-11-28 MED ORDER — CALCIUM CARBONATE ANTACID 500 MG PO CHEW: 2.0000 | CHEWABLE_TABLET | ORAL | Status: DC | PRN

## 2021-11-28 MED ORDER — ACETAMINOPHEN 325 MG PO TABS
ORAL_TABLET | ORAL | Status: AC
  Administered 2021-11-28: 650 mg via ORAL
  Filled 2021-11-28: qty 2

## 2021-11-28 NOTE — OB Triage Note (Addendum)
Patient is normally seen in [place].  Last seen at [facility] per patient.  Patient complains of headache that she rates as 8/10 that started yesterday evening.  She reports that she took 1 regular strength tylenol without any improvement last night.  Patient describes headache as pressure over her left eye.  Patient reports feeling slight nausea.  Patient reports positive fetal movements.  No bleeding, no change in discharge, no leaking of fluid reported.  Tylenol 650mg  adm as ordered by provider.  VSS.  Labs drawn.  Urine sent to lab for pc ratio. Labs reviewed by provider.  Headache resolved after tylenol given.  Patient discharged home. ?

## 2021-11-28 NOTE — Discharge Instructions (Signed)
Please keep your next scheduled appointment.  If you have questions or concerns please call your on call provider.  If you have urgent concerns go to the nearest emergency department for evaluation.  You may use tylenol during pregnancy for pain.  Regular strength or extra strength is fine to use.  Follow package instructions for dosage and frequency. ?

## 2021-11-28 NOTE — Discharge Summary (Signed)
Wendy Aguilar is a 21 y.o. female. She is at [redacted]w[redacted]d gestation. Patient's last menstrual period was 05/31/2021 (exact date). ?Estimated Date of Delivery: 03/20/22 ? ?Pren[facility] care site: [facility] ? 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 ?Chief complaint: headache  ? ?She came in reporting a headache since last night, not relieved with 325mg  Tylenol that she took last night. Pain is 8/10. Denies changes of vision or RUQ pain. ? ?S: Resting comfortably. no CTX, no VB.no LOF,  Active fetal movement. Denies: HA, visual changes, SOB, or RUQ/epigastric pain ? ?Maternal Medical History:  ? ?Past Medical History:  ?Diagnosis Date  ? Medical history non-contributory   ? ? ?Past Surgical History:  ?Procedure Laterality Date  ? CESAREAN SECTION N/A 10/10/2016  ? Procedure: CESAREAN SECTION;  Surgeon: 12/10/2016, MD;  Location: [facility] BIRTHING SUITES;  Service: Obstetrics;  Laterality: N/A;  ? CESAREAN SECTION N/A 03/19/2018  ? Procedure: CESAREAN SECTION;  Surgeon: 03/21/2018, MD;  Location: [facility] BIRTHING SUITES;  Service: Obstetrics;  Laterality: N/A;  ? ? ?No Known Allergies ? ?Prior to Admission medications   ?Medication Sig Start Date End Date Taking? Authorizing Provider  ?acetaminophen (TYLENOL) 325 MG tablet Take 650 mg by mouth every 6 (six) hours as needed for headache.   Yes [provider]  ?prenatal vitamin w/FE, FA (PRENATAL 1 + 1) 27-1 MG TABS tablet Take 1 tablet by mouth daily at 12 noon. 07/29/21  Yes 07/31/21, MD  ? ? ? ? ?Social History: She  reports that she has never smoked. She has never used smokeless tobacco. She reports that she does not currently use alcohol. She reports that she does not use drugs. ? ?Family History: family history includes Hypertension in her mother.  no history of gyn cancers ? ?Review of Systems: A full review of systems was performed and negative except as  noted in the HPI.   ? ? ?O: ? BP 109/84   Pulse 81   Temp 99 ?F (37.2 ?C) (Oral)   Resp 14   Ht 5\' 9"  (1.753 m)   Wt 82.1 kg Comment: 181lbs  LMP 05/31/2021 (Exact Date)   BMI 26.73 kg/m?  ?Results for orders placed or performed during the hospital encounter of 11/28/21 (from the past 48 hour(s))  ?Comprehensive metabolic panel  ? Collection Time: 11/28/21 12:55 PM  ?Result Value Ref Range  ? Sodium 135 135 - 145 mmol/L  ? Potassium 3.5 3.5 - 5.1 mmol/L  ? Chloride 109 98 - 111 mmol/L  ? CO2 23 22 - 32 mmol/L  ? Glucose, Bld 79 70 - 99 mg/dL  ? BUN 7 6 - 20 mg/dL  ? Creatinine, Ser 0.58 0.44 - 1.00 mg/dL  ? Calcium 8.4 (L) 8.9 - 10.3 mg/dL  ? Total Protein 6.9 6.5 - 8.1 g/dL  ? Albumin 3.0 (L) 3.5 - 5.0 g/dL  ? AST 15 15 - 41 U/L  ? ALT 12 0 - 44 U/L  ? Alkaline Phosphatase 47 38 - 126 U/L  ? Total Bilirubin 0.6 0.3 - 1.2 mg/dL  ? GFR, Estimated >60 >60 mL/min  ? Anion gap 3 (L) 5 - 15  ?CBC  ? Collection Time: 11/28/21 12:55 PM  ?Result Value Ref Range  ? WBC 8.7 4.0 - 10.5 K/uL  ? RBC 3.98 3.87 - 5.11 MIL/uL  ? Hemoglobin 11.1 (L) 12.0 - 15.0 g/dL  ? HCT 33.7 (  L) 36.0 - 46.0 %  ? MCV 84.7 80.0 - 100.0 fL  ? MCH 27.9 26.0 - 34.0 pg  ? MCHC 32.9 30.0 - 36.0 g/dL  ? RDW 13.4 11.5 - 15.5 %  ? Platelets 174 150 - 400 K/uL  ? nRBC 0.0 0.0 - 0.2 %  ?Protein / creatinine ratio, urine  ? Collection Time: 11/28/21  1:01 PM  ?Result Value Ref Range  ? Creatinine, Urine 245 mg/dL  ? Total Protein, Urine 20 mg/dL  ? Protein Creatinine Ratio 0.08 0.00 - 0.15 mg/mg[Cre]  ?  ? ?Constitutional: NAD, AAOx3  ?HE/ENT: extraocular movements grossly intact, moist mucous membranes ?CV: RRR ?PULM: nl respiratory effort, CTABL     ?Abd: gravid, non-tender, non-distended, soft      ?Ext: Non-tender, Nonedematous   ?Psych: mood appropriate, speech normal ?Pelvic: deferred ? ?Fetal  monitoring: Cat 1 Appropriate for GA ?Baseline: 155bpm ?Variability: moderate ?Accelerations: present x >2 10x10 ?Decelerations absent ?Time  ? ?A/P: 21 y.o. [redacted]w[redacted]d here for antenatal surveillance for headache ? ?Principle Diagnosis:  High risk pregnancy in second trimester ? ?Labor: not present.  ?Fetal Wellbeing: Reassuring Cat 1 tracing. ?Reactive NST  ?Headache resolved with 650mg  Tylenol PO ?Pre-eclampsia: not present, CBC, CMP, and P/C ratio negative, BP WNL ?D/c home stable, precautions reviewed, follow-up as scheduled.  ? ? ? , CNM ?11/28/2021 ?2:46 PM ? ?

## 2021-12-03 ENCOUNTER — Ambulatory Visit (INDEPENDENT_AMBULATORY_CARE_PROVIDER_SITE_OTHER)
Admission: EM | Admit: 2021-12-03 | Discharge: 2021-12-03 | Disposition: A | Discharge status: Home / Self Care | Payer: BC Managed Care – PPO | Source: Home / Self Care

## 2021-12-03 ENCOUNTER — Other Ambulatory Visit: Payer: Self-pay

## 2021-12-03 ENCOUNTER — Encounter (HOSPITAL_COMMUNITY): Payer: Self-pay | Admitting: Obstetrics & Gynecology

## 2021-12-03 ENCOUNTER — Encounter: Payer: Self-pay | Admitting: Emergency Medicine

## 2021-12-03 ENCOUNTER — Inpatient Hospital Stay (HOSPITAL_COMMUNITY)
Admission: AD | Admit: 2021-12-03 | Discharge: 2021-12-03 | Disposition: A | Discharge status: Home / Self Care | Payer: BC Managed Care – PPO | Attending: Obstetrics & Gynecology | Admitting: Obstetrics & Gynecology

## 2021-12-03 DIAGNOSIS — K529 Noninfective gastroenteritis and colitis, unspecified: Secondary | ICD-10-CM

## 2021-12-03 DIAGNOSIS — R8290 Unspecified abnormal findings in urine: Secondary | ICD-10-CM | POA: Diagnosis not present

## 2021-12-03 DIAGNOSIS — O99612 Diseases of the digestive system complicating pregnancy, second trimester: Secondary | ICD-10-CM | POA: Insufficient documentation

## 2021-12-03 DIAGNOSIS — Z3A24 24 weeks gestation of pregnancy: Secondary | ICD-10-CM | POA: Diagnosis not present

## 2021-12-03 DIAGNOSIS — O26892 Other specified pregnancy related conditions, second trimester: Secondary | ICD-10-CM

## 2021-12-03 DIAGNOSIS — O212 Late vomiting of pregnancy: Secondary | ICD-10-CM | POA: Diagnosis present

## 2021-12-03 LAB — POCT URINALYSIS DIP (MANUAL ENTRY)
Blood, UA: NEGATIVE
Glucose, UA: NEGATIVE mg/dL
Leukocytes, UA: NEGATIVE
Nitrite, UA: NEGATIVE
Protein Ur, POC: 100 mg/dL — AB
Spec Grav, UA: >=1.03 — AB (ref 1.010–1.025)
Urobilinogen, UA: >=8 E.U./dL — AB
pH, UA: 6 (ref 5.0–8.0)

## 2021-12-03 MED ORDER — ONDANSETRON 4 MG PO TBDP: 4.0000 mg | ORAL_TABLET | Freq: Four times a day (QID) | ORAL | 0 refills | Status: DC | PRN

## 2021-12-03 MED ORDER — LACTATED RINGERS IV BOLUS
1000.0000 mL | Freq: Once | INTRAVENOUS | Status: AC
  Administered 2021-12-03: 1000 mL via INTRAVENOUS

## 2021-12-03 MED ORDER — SODIUM CHLORIDE 0.9 % IV SOLN
8.0000 mg | Freq: Once | INTRAVENOUS | Status: AC
  Administered 2021-12-03: 8 mg via INTRAVENOUS
  Filled 2021-12-03: qty 4

## 2021-12-03 NOTE — Discharge Instructions (Addendum)
Go immediately to  [facility] the MAU unit for further evaluation given abnormal findings see on on your urine analysis. You are at minimally severely dehydrated and you also need at minimum a microscopic urine analysis and possible labs. ?MAU is equipped to assess you and your baby, give your advanced pregnancy, recommend evaluation at there unit today. ?

## 2021-12-03 NOTE — ED Provider Notes (Signed)
?[facility] ? ? ? ?CSN: [id number] ?Arrival date & time: 12/03/21  [id number] ? ? ?  ? ?History   ?Chief Complaint ?Chief Complaint  ?Patient presents with  ? Vaginal Discharge  ? Vaginal Itching  ? ? ?HPI ?Wendy Aguilar is a 21 y.o. female.  ? ?HPI ?Patient presented initially for vaginal discharge and vaginitis symptoms.  A UA was collected to ensure patient does not have any urinary tract infection given that she is in a vin state of pregnancy at 24 weeks.  On visual assessment of urine her urine was dark orange/amber and UA findings were significant for abnormal ketones, bilirubin, protein, with an SPG of greater than 1.030.  Patient reports she has been vomiting several times over the last 2 days.  She denies any abdominal pain or vaginal bleeding.  Patient [facility] in which she presented for evaluation of headache.  Patient was monitored and subsequently discharged.  Patient denies any active urinary symptoms however denies any recent intake of any medications or supplements.  She has not taken any AZO's which is known to change the color of urine. ?Past Medical History:  ?Diagnosis Date  ? Medical history non-contributory   ? ? ?Patient Active Problem List  ? Diagnosis Date Noted  ? Abnormal fetal ultrasound 10/16/2021  ? Supervision of other normal pregnancy, antepartum 07/29/2021  ? History of cesarean section x 2 10/21/2017  ? ? ?Past Surgical History:  ?Procedure Laterality Date  ? CESAREAN SECTION N/A 10/10/2016  ? Procedure: CESAREAN SECTION;  Surgeon: Adam PhenixJames G Arnold, MD;  [facility];  Service: Obstetrics;  Laterality: N/A;  ? CESAREAN SECTION N/A 03/19/2018  ? Procedure: CESAREAN SECTION;  Surgeon: Conan Bowensavis, Kelly M, MD;  [facility];  Service: Obstetrics;  Laterality: N/A;  ? ? ?OB History   ? ? Gravida  ?3  ? Para  ?2  ? Term  ?2  ? Preterm  ?0  ? AB  ?0  ? Living  ?2  ?  ? ? SAB  ?0  ? IAB  ?0  ? Ectopic  ?0  ?  Multiple  ?0  ? Live Births  ?2  ?   ?  ?  ? ? ? ?Home Medications   ? ?Prior to Admission medications   ?Medication Sig Start Date End Date Taking? Authorizing Provider  ?acetaminophen (TYLENOL) 325 MG tablet Take 650 mg by mouth every 6 (six) hours as needed for headache.    [provider]  ?prenatal vitamin w/FE, FA (PRENATAL 1 + 1) 27-1 MG TABS tablet Take 1 tablet by mouth daily at 12 noon. 07/29/21   Worthy RancherAlbert, Christina M, MD  ? ? ?Family History ?Family History  ?Problem Relation Age of Onset  ? Hypertension Mother   ? Alcohol abuse Neg Hx   ? Arthritis Neg Hx   ? Asthma Neg Hx   ? Birth defects Neg Hx   ? COPD Neg Hx   ? Depression Neg Hx   ? Diabetes Neg Hx   ? Drug abuse Neg Hx   ? Early death Neg Hx   ? Hearing loss Neg Hx   ? Heart disease Neg Hx   ? Hyperlipidemia Neg Hx   ? Kidney disease Neg Hx   ? Learning disabilities Neg Hx   ? Mental illness Neg Hx   ? Mental retardation Neg Hx   ? Miscarriages / Stillbirths Neg Hx   ?  Stroke Neg Hx   ? Vision loss Neg Hx   ? Varicose Veins Neg Hx   ? ? ?Social History ?Social History  ? ?Tobacco Use  ? Smoking status: Never  ? Smokeless tobacco: Never  ?Vaping Use  ? Vaping Use: Never used  ?Substance Use Topics  ? Alcohol use: Not Currently  ?  Comment: not while preg  ? Drug use: No  ? ? ? ?Allergies   ?Patient has no known allergies. ? ? ?Review of Systems ?Review of Systems ?Pertinent negatives listed in HPI  ? ?Physical Exam ?Triage Vital Signs ?ED Triage Vitals  ?Enc Vitals Group  ?   BP 12/03/21 1000 114/68  ?   Pulse Rate 12/03/21 1000 82  ?   Resp 12/03/21 1000 16  ?   Temp 12/03/21 1000 98.9 ?F (37.2 ?C)  ?   Temp Source 12/03/21 1000 Oral  ?   SpO2 12/03/21 1000 99 %  ?   Weight --   ?   Height --   ?   Head Circumference --   ?   Peak Flow --   ?   Pain Score 12/03/21 1005 0  ?   Pain Loc --   ?   Pain Edu? --   ?   Excl. in GC? --   ? ?No data found. ? ?Updated Vital Signs ?BP 114/68 (BP Location: Left Arm)   Pulse 82   Temp 98.9 ?F (37.2  ?C) (Oral)   Resp 16   LMP 05/31/2021 (Exact Date)   SpO2 99%  ? ?Visual Acuity ?Right Eye Distance:   ?Left Eye Distance:   ?Bilateral Distance:   ? ?Right Eye Near:   ?Left Eye Near:    ?Bilateral Near:    ? ?Physical Exam ?Deferred-Vaginal self swab collected. ? ?UC Treatments / Results  ?Labs ?(all labs ordered are listed, but only abnormal results are displayed) ?Labs Reviewed  ?POCT URINALYSIS DIP (MANUAL ENTRY) - Abnormal; Notable for the following components:  ?    Result Value  ? Color, UA orange (*)   ? Clarity, UA cloudy (*)   ? Bilirubin, UA moderate (*)   ? Ketones, POC UA >= (160) (*)   ? Spec Grav, UA >=1.030 (*)   ? Protein Ur, POC =100 (*)   ? Urobilinogen, UA >=8.0 (*)   ? All other components within normal limits  ?CERVICOVAGINAL ANCILLARY ONLY  ? ? ?EKG ? ? ?Radiology ?No results found. ? ?Procedures ?Procedures (including critical care time) ? ?Medications Ordered in UC ?Medications - No data to display ? ?Initial Impression / Assessment and Plan / UC Course  ?I have reviewed the triage vital signs and the nursing notes. ? ?Pertinent labs & imaging results that were available during my care of the patient were reviewed by me and considered in my medical decision making (see chart for details). ? ?  ?Patient is being discharged to follow-up at the MAU given abnormal urine findings and patient has had hyperemesis over the last 2 days.  At minimum I feel patient may be dehydrated however giving the advancement of her pregnancies she warrants further evaluation and management in the setting equipped to manage pregnant women therefore she has been directed to follow-up at the MAU.  Patient verbalized understanding and agreement with plan.  Patient's vaginal cytology is  pending. ?Final Clinical Impressions(s) / UC Diagnoses  ? ?Final diagnoses:  ?[redacted] weeks gestation of pregnancy  ?Abnormal urinalysis  ?Abnormal finding in  urine  ? ? ? ?Discharge Instructions   ? ?  ?Go immediately to  [facility] the MAU unit for further evaluation given abnormal findings see on on your urine analysis. You are at minimally severely dehydrated and you also need at minimum a microscopic urine analysis and possible labs. ?MAU is equipped to assess you and your baby, give your advanced pregnancy, recommend evaluation at there unit today. ? ? ?ED Prescriptions   ?None ?  ? ?PDMP not reviewed this encounter. ?  ?Bing Neighbors, FNP ?12/03/21 1026 ? ?

## 2021-12-03 NOTE — MAU Provider Note (Signed)
?History  ?  ? ?[id number] ? ?Arrival date and time: 12/03/21 1317 ?  ? ?Chief Complaint  ?Patient presents with  ? Emesis  ? Nausea  ? ? ? ?HPI ?Wendy Aguilar is a 21 y.o. at [redacted]w[redacted]d by ultrasound who presents for n/v/d. ?Presented to urgent care this morning for STI testing after finding out her significant other cheated. Results in process. Was sent here for IV fluids due to urine dip showing ketones & high specific gravity.  ?Patient reports vomiting for the last 2 days with difficulty keeping anything down. Doesn't have antiemetics & hasn't treated symptoms. This is new with the pregnancy. Also has had 2 episodes of diarrhea today. No sick contacts. Denies fever, abdominal pain, or vaginal bleeding. Reports good fetal movement.   ? ?OB History   ? ? Gravida  ?3  ? Para  ?2  ? Term  ?2  ? Preterm  ?0  ? AB  ?0  ? Living  ?2  ?  ? ? SAB  ?0  ? IAB  ?0  ? Ectopic  ?0  ? Multiple  ?0  ? Live Births  ?2  ?   ?  ?  ? ? ?Past Medical History:  ?Diagnosis Date  ? Medical history non-contributory   ? ? ?Past Surgical History:  ?Procedure Laterality Date  ? CESAREAN SECTION N/A 10/10/2016  ? Procedure: CESAREAN SECTION;  Surgeon: Adam Phenix, MD;  Location: [facility] BIRTHING SUITES;  Service: Obstetrics;  Laterality: N/A;  ? CESAREAN SECTION N/A 03/19/2018  ? Procedure: CESAREAN SECTION;  Surgeon: Conan Bowens, MD;  Location: [facility] BIRTHING SUITES;  Service: Obstetrics;  Laterality: N/A;  ? ? ?Family History  ?Problem Relation Age of Onset  ? Hypertension Mother   ? Alcohol abuse Neg Hx   ? Arthritis Neg Hx   ? Asthma Neg Hx   ? Birth defects Neg Hx   ? COPD Neg Hx   ? Depression Neg Hx   ? Diabetes Neg Hx   ? Drug abuse Neg Hx   ? Early death Neg Hx   ? Hearing loss Neg Hx   ? Heart disease Neg Hx   ? Hyperlipidemia Neg Hx   ? Kidney disease Neg Hx   ? Learning disabilities Neg Hx   ? Mental illness Neg Hx   ? Mental retardation Neg Hx   ? Miscarriages / Stillbirths Neg Hx   ? Stroke Neg Hx   ? Vision loss Neg Hx   ? Varicose  Veins Neg Hx   ? ? ?No Known Allergies ? ?No current facility-administered medications on file prior to encounter.  ? ?Current Outpatient Medications on File Prior to Encounter  ?Medication Sig Dispense Refill  ? prenatal vitamin w/FE, FA (PRENATAL 1 + 1) 27-1 MG TABS tablet Take 1 tablet by mouth daily at 12 noon. 30 tablet 11  ? acetaminophen (TYLENOL) 325 MG tablet Take 650 mg by mouth every 6 (six) hours as needed for headache.    ? ? ? ?ROS ?Pertinent positives and negative per HPI, all others reviewed and negative ? ?Physical Exam  ? ?BP (!) 112/47 (BP Location: Left Arm)   Pulse 81   Temp 99 ?F (37.2 ?C) (Oral)   Resp 20   Ht 5\' 9"  (1.753 m)   Wt 82.4 kg   LMP 05/31/2021 (Exact Date)   SpO2 100%   BMI 26.83 kg/m?  ? ?Patient Vitals for the past 24 hrs: ? BP Temp Temp  src Pulse Resp SpO2 Height Weight  ?12/03/21 1614 (!) 112/47 -- -- 81 -- -- -- --  ?12/03/21 1357 119/64 -- -- (!) 113 -- 100 % -- --  ?12/03/21 1340 118/76 99 ?F (37.2 ?C) Oral (!) 118 20 100 % -- --  ?12/03/21 1335 -- -- -- -- -- -- 5\' 9"  (1.753 m) 82.4 kg  ? ? ?Physical Exam ?Vitals and nursing note reviewed.  ?Constitutional:   ?   General: She is not in acute distress. ?   Appearance: Normal appearance.  ?HENT:  ?   Head: Normocephalic and atraumatic.  ?Eyes:  ?   General: No scleral icterus. ?   Conjunctiva/sclera: Conjunctivae normal.  ?   Pupils: Pupils are equal, round, and reactive to light.  ?Pulmonary:  ?   Effort: Pulmonary effort is normal. No respiratory distress.  ?Abdominal:  ?   Palpations: Abdomen is soft.  ?   Tenderness: There is no abdominal tenderness.  ?Skin: ?   General: Skin is warm and dry.  ?Neurological:  ?   Mental Status: She is alert.  ?Psychiatric:     ?   Mood and Affect: Mood normal.     ?   Behavior: Behavior normal.  ?  ? ? ?FHT ?Baseline 150, moderate variability, 15x15 accels, no decels ?Toco: x1 ?Cat: 1 ? ?Labs ?Results for orders placed or performed during the hospital encounter of 12/03/21 (from  the past 24 hour(s))  ?POCT urinalysis dipstick     Status: Abnormal  ? Collection Time: 12/03/21 10:07 AM  ?Result Value Ref Range  ? Color, UA orange (A) yellow  ? Clarity, UA cloudy (A) clear  ? Glucose, UA negative negative mg/dL  ? Bilirubin, UA moderate (A) negative  ? Ketones, POC UA >= (160) (A) negative mg/dL  ? Spec Grav, UA >=1.030 (A) 1.010 - 1.025  ? Blood, UA negative negative  ? pH, UA 6.0 5.0 - 8.0  ? Protein Ur, POC =100 (A) negative mg/dL  ? Urobilinogen, UA >=8.0 (A) 0.2 or 1.0 E.U./dL  ? Nitrite, UA Negative Negative  ? Leukocytes, UA Negative Negative  ? ? ?Imaging ?No results found. ? ?MAU Course  ?Procedures ?Lab Orders  ?No laboratory test(s) ordered today  ? ?Meds ordered this encounter  ?Medications  ? lactated ringers bolus 1,000 mL  ? ondansetron (ZOFRAN) 8 mg in sodium chloride 0.9 % 50 mL IVPB  ? ondansetron (ZOFRAN-ODT) 4 MG disintegrating tablet  ?  Sig: Take 1 tablet (4 mg total) by mouth every 6 (six) hours as needed for nausea or vomiting.  ?  Dispense:  20 tablet  ?  Refill:  0  ?  Order Specific Question:   Supervising Provider  ?  Answer07/04/23 Venora Maples  ? ?Imaging Orders  ?No imaging studies ordered today  ? ? ?MDM ?Reviewed urine dip results from urgent care. Ordered IV fluids & zofran. Patient reports improvement in symptoms & able to tolerate PO challenge ? ?Offered additional STI testing including blood work. Patient had negative results at her new ob & declines at this time.  ?Assessment and Plan  ? ?1. Gastroenteritis presumed infectious   ?2. [redacted] weeks gestation of pregnancy   ? ?-Rx zofran ?-Bland diet ?-Reviewed reasons to return to MAU ? ?[[id number]], NP ?12/03/21 ?4:56 PM ? ? ?

## 2021-12-03 NOTE — MAU Note (Signed)
[place] A Torr is a 21 y.o. at [redacted]w[redacted]d here in MAU reporting: was previously seen @ Urgent Care today for STI screening and N/V/D.  Reports unable to keep anything down. ? ?Onset of complaint: 2 days ago ?Pain score: 0 ?Vitals:  ? 12/03/21 1340  ?BP: 118/76  ?Pulse: (!) 118  ?Resp: 20  ?Temp: 99 ?F (37.2 ?C)  ?SpO2: 100%  ?   ?FHT: 164 bpm ?Lab orders placed from triage:    ?

## 2021-12-03 NOTE — ED Triage Notes (Signed)
Pt presents with vaginal itching and discharge x 2-3 days.  ?

## 2021-12-04 LAB — CERVICOVAGINAL ANCILLARY ONLY
Bacterial Vaginitis (gardnerella): NEGATIVE
Candida Glabrata: NEGATIVE
Candida Vaginitis: NEGATIVE
Chlamydia: NEGATIVE
Comment: NEGATIVE
Comment: NEGATIVE
Comment: NEGATIVE
Comment: NEGATIVE
Comment: NEGATIVE
Comment: NORMAL
Neisseria Gonorrhea: NEGATIVE
Trichomonas: NEGATIVE

## 2021-12-09 ENCOUNTER — Encounter: Payer: BC Managed Care – PPO | Admitting: Obstetrics & Gynecology

## 2021-12-09 ENCOUNTER — Telehealth (INDEPENDENT_AMBULATORY_CARE_PROVIDER_SITE_OTHER): Payer: BC Managed Care – PPO | Admitting: Certified Nurse Midwife

## 2021-12-09 DIAGNOSIS — Z3492 Encounter for supervision of normal pregnancy, unspecified, second trimester: Secondary | ICD-10-CM

## 2021-12-09 NOTE — Progress Notes (Signed)
I connected with  Wendy Aguilar on 12/09/21 at  3:35 PM EDT by MyChart Virtual Video Visit and verified that I am speaking with the correct person using two identifiers. ?  ?I discussed the limitations, risks, security and privacy concerns of performing an evaluation and management service by telephone and the availability of in person appointments. I also discussed with the patient that there may be a patient responsible charge related to this service. The patient expressed understanding and agreed to proceed. ? ?Guy Begin, CMA ?12/09/2021  3:32 PM  ?

## 2021-12-11 ENCOUNTER — Telehealth (INDEPENDENT_AMBULATORY_CARE_PROVIDER_SITE_OTHER): Payer: BC Managed Care – PPO | Admitting: Obstetrics and Gynecology

## 2021-12-11 DIAGNOSIS — Z3A25 25 weeks gestation of pregnancy: Secondary | ICD-10-CM

## 2021-12-11 DIAGNOSIS — Z98891 History of uterine scar from previous surgery: Secondary | ICD-10-CM

## 2021-12-11 DIAGNOSIS — Z3480 Encounter for supervision of other normal pregnancy, unspecified trimester: Secondary | ICD-10-CM

## 2021-12-11 DIAGNOSIS — O34219 Maternal care for unspecified type scar from previous cesarean delivery: Secondary | ICD-10-CM

## 2021-12-11 DIAGNOSIS — O283 Abnormal ultrasonic finding on antenatal screening of mother: Secondary | ICD-10-CM

## 2021-12-11 NOTE — Progress Notes (Signed)
? ?  OBSTETRICS PRENATAL VIRTUAL VISIT ENCOUNTER NOTE ? ?Provider location: [facility] [employer] [facility] [employer] [facility]  ? ?Patient location: Home ? ?I connected with Wendy A Lansdale on 12/11/21 at  9:55 AM EDT by MyChart Video Encounter and verified that I am speaking with the correct person using two identifiers. I discussed the limitations, risks, security and privacy concerns of performing an evaluation and management service virtually and the availability of in person appointments. I also discussed with the patient that there may be a patient responsible charge related to this service. The patient expressed understanding and agreed to proceed. ?Subjective:  ?Wendy Aguilar is a 21 y.o. G3P2002 at [redacted]w[redacted]d being seen today for ongoing prenatal care.  She is currently monitored for the following issues for this high-risk pregnancy and has History of cesarean section x 2; Supervision of other normal pregnancy, antepartum; and Abnormal fetal ultrasound on their problem list. ? ?Patient reports no complaints.  Contractions: Not present. Vag. Bleeding: None.  Movement: Present. Denies any leaking of fluid.  ? ?The following portions of the patient's history were reviewed and updated as appropriate: allergies, current medications, past family history, past medical history, past social history, past surgical history and problem list.  ? ?Objective:  ?There were no vitals filed for this visit. ? ?Fetal Status:     Movement: Present    ? ?General:  Alert, oriented and cooperative. Patient is in no acute distress.  ?Respiratory: Normal respiratory effort, no problems with respiration noted  ?Mental Status: Normal mood and affect. Normal behavior. Normal judgment and thought content.  ?Rest of physical exam deferred due to type of encounter ? ?Imaging: ?No results found. ? ?Assessment and Plan:  ?Pregnancy: [id number] at [redacted]w[redacted]d ?1. Supervision of other normal pregnancy, antepartum ?Continue routine care, in person  visit in 2 weeks for 2 hour GTT ? ?2. [redacted] weeks gestation of pregnancy ? ? ?3. History of cesarean section x 2 ?Discussed in detail she can have TOLAC , but she will not be augmented/induced ?Also discussed sequelae of high order cesarean sections if patient desires a large family ? ?4. Abnormal fetal ultrasound ?Absent nasal bone, normal NIPm pt has f/u u/s in2-3 weeks ? ?Preterm labor symptoms and general obstetric precautions including but not limited to vaginal bleeding, contractions, leaking of fluid and fetal movement were reviewed in detail with the patient. ?I discussed the assessment and treatment plan with the patient. The patient was provided an opportunity to ask questions and all were answered. The patient agreed with the plan and demonstrated an understanding of the instructions. The patient was advised to call back or seek an in-person office evaluation/go to MAU at [facility] for any urgent or concerning symptoms. ?Please refer to After Visit Summary for other counseling recommendations.  ? ?I provided 20 minutes of face-to-face time during this encounter. ? ?Return in about 2 weeks (around 12/25/2021) for [facility], in person, 2 hr GTT, 3rd trim labs. ? ?Future Appointments  ?Date Time Provider [place]  ?12/29/2021  8:30 AM [facility] NURSE [facility]  ?12/29/2021  8:45 AM [facility] US4 [facility]  ? ? ?Griffin Basil, MD ?[facility] for [employer], [facility]  ?

## 2021-12-11 NOTE — Progress Notes (Signed)
I connected with  Wendy Aguilar on 12/11/21 at  9:55 AM EDT by MyChart Virtual Video Visit and verified that I am speaking with the correct person using two identifiers. ?  ?I discussed the limitations, risks, security and privacy concerns of performing an evaluation and management service by telephone and the availability of in person appointments. I also discussed with the patient that there may be a patient responsible charge related to this service. The patient expressed understanding and agreed to proceed. ? ?Guy Begin, CMA ?12/11/2021  9:53 AM  ?

## 2021-12-26 ENCOUNTER — Encounter (HOSPITAL_COMMUNITY): Payer: Self-pay | Admitting: Family Medicine

## 2021-12-26 ENCOUNTER — Inpatient Hospital Stay (HOSPITAL_COMMUNITY)
Admission: AD | Admit: 2021-12-26 | Discharge: 2021-12-26 | Disposition: A | Discharge status: Home / Self Care | Payer: BC Managed Care – PPO | Attending: Family Medicine | Admitting: Family Medicine

## 2021-12-26 DIAGNOSIS — R102 Pelvic and perineal pain: Secondary | ICD-10-CM | POA: Diagnosis not present

## 2021-12-26 DIAGNOSIS — Z3A28 28 weeks gestation of pregnancy: Secondary | ICD-10-CM | POA: Diagnosis not present

## 2021-12-26 DIAGNOSIS — O26893 Other specified pregnancy related conditions, third trimester: Secondary | ICD-10-CM | POA: Diagnosis not present

## 2021-12-26 DIAGNOSIS — Z3480 Encounter for supervision of other normal pregnancy, unspecified trimester: Secondary | ICD-10-CM

## 2021-12-26 LAB — URINALYSIS, ROUTINE W REFLEX MICROSCOPIC
Bilirubin Urine: NEGATIVE
Glucose, UA: NEGATIVE mg/dL
Hgb urine dipstick: NEGATIVE
Ketones, ur: NEGATIVE mg/dL
Leukocytes,Ua: NEGATIVE
Nitrite: NEGATIVE
Protein, ur: NEGATIVE mg/dL
Specific Gravity, Urine: 1.024 (ref 1.005–1.030)
pH: 7 (ref 5.0–8.0)

## 2021-12-26 LAB — WET PREP, GENITAL
Sperm: NONE SEEN
Trich, Wet Prep: NONE SEEN
WBC, Wet Prep HPF POC: <10 (ref <10)
Yeast Wet Prep HPF POC: NONE SEEN

## 2021-12-26 MED ORDER — CYCLOBENZAPRINE HCL 10 MG PO TABS: 10.0000 mg | ORAL_TABLET | Freq: Two times a day (BID) | ORAL | 0 refills | Status: DC | PRN

## 2021-12-26 MED ORDER — CYCLOBENZAPRINE HCL 5 MG PO TABS
10.0000 mg | ORAL_TABLET | Freq: Once | ORAL | Status: AC
  Administered 2021-12-26: 10 mg via ORAL
  Filled 2021-12-26: qty 2

## 2021-12-26 MED ORDER — ACETAMINOPHEN 500 MG PO TABS
1000.0000 mg | ORAL_TABLET | Freq: Once | ORAL | Status: AC
  Administered 2021-12-26: 1000 mg via ORAL
  Filled 2021-12-26: qty 2

## 2021-12-26 NOTE — MAU Provider Note (Signed)
History     CSN: [id number]  Arrival date and time: 12/26/21 1053   Event Date/Time   First Provider Initiated Contact with Patient 12/26/21 1220      Chief Complaint  Patient presents with   Pelvic Pain   HPI  Wendy Aguilar is a 21 y.o. year old G81P2002 female at [redacted]w[redacted]d weeks gestation who presents to MAU reporting pelvic pain that started last night and has progressively gotten worse. She denies any recent SI. She denies any VB or LOF. She reports (+) FM. She states, "I've never had this kind of pain with my other pregnancies." She receives [facility] with [facility]; next appt is 12/29/2021.   OB History     Gravida  3   Para  2   Term  2   Preterm  0   AB  0   Living  2      SAB  0   IAB  0   Ectopic  0   Multiple  0   Live Births  2           Past Medical History:  Diagnosis Date   Medical history non-contributory     Past Surgical History:  Procedure Laterality Date   CESAREAN SECTION N/A 10/10/2016   Procedure: CESAREAN SECTION;  Surgeon: Adam Phenix, MD;  Location: [facility] BIRTHING SUITES;  Service: Obstetrics;  Laterality: N/A;   CESAREAN SECTION N/A 03/19/2018   Procedure: CESAREAN SECTION;  Surgeon: Conan Bowens, MD;  Location: [facility] BIRTHING SUITES;  Service: Obstetrics;  Laterality: N/A;    Family History  Problem Relation Age of Onset   Hypertension Mother    Alcohol abuse Neg Hx    Arthritis Neg Hx    Asthma Neg Hx    Birth defects Neg Hx    COPD Neg Hx    Depression Neg Hx    Diabetes Neg Hx    Drug abuse Neg Hx    Early death Neg Hx    Hearing loss Neg Hx    Heart disease Neg Hx    Hyperlipidemia Neg Hx    Kidney disease Neg Hx    Learning disabilities Neg Hx    Mental illness Neg Hx    Mental retardation Neg Hx    Miscarriages / Stillbirths Neg Hx    Stroke Neg Hx    Vision loss Neg Hx    Varicose Veins Neg Hx     Social History   Tobacco Use   Smoking status: Never   Smokeless tobacco: Never  Vaping Use   Vaping Use:  Never used  Substance Use Topics   Alcohol use: Not Currently    Comment: not while preg   Drug use: No    Allergies: No Known Allergies  Medications Prior to Admission  Medication Sig Dispense Refill Last Dose   prenatal vitamin w/FE, FA (PRENATAL 1 + 1) 27-1 MG TABS tablet Take 1 tablet by mouth daily at 12 noon. 30 tablet 11 12/25/2021   acetaminophen (TYLENOL) 325 MG tablet Take 650 mg by mouth every 6 (six) hours as needed for headache. (Patient not taking: Reported on 12/11/2021)      ondansetron (ZOFRAN-ODT) 4 MG disintegrating tablet Take 1 tablet (4 mg total) by mouth every 6 (six) hours as needed for nausea or vomiting. (Patient not taking: Reported on 12/11/2021) 20 tablet 0     Review of Systems  Constitutional: Negative.   HENT: Negative.    Eyes: Negative.  Respiratory: Negative.    Cardiovascular: Negative.   Gastrointestinal: Negative.   Endocrine: Negative.   Genitourinary:  Positive for pelvic pain (progressively worse).  Musculoskeletal: Negative.   Skin: Negative.   Allergic/Immunologic: Negative.   Neurological: Negative.   Hematological: Negative.   Psychiatric/Behavioral: Negative.    Physical Exam   Blood pressure 122/63, pulse 96, temperature 98.5 F (36.9 C), temperature source Oral, resp. rate 18, height 5\' 9"  (1.753 m), weight 85.1 kg, last menstrual period 05/31/2021, SpO2 100 %.  Physical Exam Vitals and nursing note reviewed. Exam conducted with a chaperone present.  Constitutional:      Appearance: Normal appearance. She is normal weight.  Cardiovascular:     Rate and Rhythm: Normal rate.  Pulmonary:     Effort: Pulmonary effort is normal.  Abdominal:     Palpations: Abdomen is soft.  Genitourinary:    General: Normal vulva.     Comments: Pelvic exam: External genitalia normal, SE: vaginal walls pink and well rugated, cervix is smooth, pink, no lesions, small amt of thick, white vaginal d/c -- WP, GC/CT done, cervix visually closed, Uterus  is non-tender, S=D, no CMT or friability, RT adnexal tenderness - c/w RLP.  Musculoskeletal:        General: Normal range of motion.  Skin:    General: Skin is warm and dry.  Neurological:     Mental Status: She is alert and oriented to person, place, and time.  Psychiatric:        Mood and Affect: Mood normal.        Behavior: Behavior normal.        Thought Content: Thought content normal.        Judgment: Judgment normal.   REACTIVE NST - FHR: 150 bpm / moderate variability / accels present / decels absent / TOCO: Occ UC with UI noted   MAU Course  Procedures  MDM CCUA Wet prep GC/CT fFN -- not sent d/t cervix being closed  Results for orders placed or performed during the hospital encounter of 12/26/21 (from the past 24 hour(s))  Urinalysis, Routine w reflex microscopic Urine, Clean Catch     Status: Abnormal   Collection Time: 12/26/21 11:33 AM  Result Value Ref Range   Color, Urine YELLOW YELLOW   APPearance HAZY (A) CLEAR   Specific Gravity, Urine 1.024 1.005 - 1.030   pH 7.0 5.0 - 8.0   Glucose, UA NEGATIVE NEGATIVE mg/dL   Hgb urine dipstick NEGATIVE NEGATIVE   Bilirubin Urine NEGATIVE NEGATIVE   Ketones, ur NEGATIVE NEGATIVE mg/dL   Protein, ur NEGATIVE NEGATIVE mg/dL   Nitrite NEGATIVE NEGATIVE   Leukocytes,Ua NEGATIVE NEGATIVE  Wet prep, genital     Status: Abnormal   Collection Time: 12/26/21 12:30 PM   Specimen: Vaginal  Result Value Ref Range   Yeast Wet Prep HPF POC NONE SEEN NONE SEEN   Trich, Wet Prep NONE SEEN NONE SEEN   Clue Cells Wet Prep HPF POC PRESENT (A) NONE SEEN   WBC, Wet Prep HPF POC <10 <10   Sperm NONE SEEN     Assessment and Plan  Pelvic pain affecting pregnancy in third trimester, antepartum  - Information provided on RLP and abdominal pain in pregnancy   28 weeks Pregnancy  - Discharge patient  - Keep scheduled appts with [facility] - Patient verbalized an understanding of the plan of care and agrees.    [facility].  CNM 12/26/2021, 2:02 PM

## 2021-12-26 NOTE — MAU Note (Signed)
Patient came into MAU at [redacted]w[redacted]d gestation with c/o of pelvic pain that started last night and is progressively getting worse. Patient denies LOF and vaginal bleeding. Patient is feeling fetal movement.

## 2021-12-29 ENCOUNTER — Ambulatory Visit (INDEPENDENT_AMBULATORY_CARE_PROVIDER_SITE_OTHER): Payer: BC Managed Care – PPO | Admitting: Family Medicine

## 2021-12-29 ENCOUNTER — Ambulatory Visit: Payer: BC Managed Care – PPO

## 2021-12-29 ENCOUNTER — Encounter: Payer: Self-pay | Admitting: Family Medicine

## 2021-12-29 VITALS — BP 122/59 | HR 92 | Wt 188.0 lb

## 2021-12-29 DIAGNOSIS — O283 Abnormal ultrasonic finding on antenatal screening of mother: Secondary | ICD-10-CM

## 2021-12-29 DIAGNOSIS — Z98891 History of uterine scar from previous surgery: Secondary | ICD-10-CM

## 2021-12-29 DIAGNOSIS — Z3480 Encounter for supervision of other normal pregnancy, unspecified trimester: Secondary | ICD-10-CM

## 2021-12-29 DIAGNOSIS — Z3A28 28 weeks gestation of pregnancy: Secondary | ICD-10-CM

## 2021-12-29 LAB — GC/CHLAMYDIA PROBE AMP (~~LOC~~) NOT AT ARMC
Chlamydia: NEGATIVE
Comment: NEGATIVE
Comment: NORMAL
Neisseria Gonorrhea: NEGATIVE

## 2021-12-29 NOTE — Progress Notes (Signed)
    Subjective:  Wendy Aguilar is a 21 y.o. G3P2002 at [redacted]w[redacted]d being seen today for ongoing prenatal care.  She is currently monitored for the following issues for this low-risk pregnancy and has History of cesarean section x 2; Supervision of other normal pregnancy, antepartum; and Abnormal fetal ultrasound on their problem list.  Patient reports no complaints.  Contractions: Not present. Vag. Bleeding: None.  Movement: Present. Denies leaking of fluid.   The following portions of the patient's history were reviewed and updated as appropriate: allergies, current medications, past family history, past medical history, past social history, past surgical history and problem list.   Objective:   Vitals:   12/29/21 1352  BP: (!) 122/59  Pulse: 92  Weight: 188 lb (85.3 kg)    Fetal Status: Fetal Heart Rate (bpm): 162 Fundal Height: 28 cm Movement: Present     General:  Alert, oriented and cooperative. Patient is in no acute distress.  Skin: Skin is warm and dry. No rash noted.   Cardiovascular: Normal heart rate noted  Respiratory: Normal respiratory effort, no problems with respiration noted  Abdomen: Soft, gravid, appropriate for gestational age. Pain/Pressure: Present     Pelvic:  Cervical exam deferred        Extremities: Normal range of motion.  Edema: None  Mental Status: Normal mood and affect. Normal behavior. Normal judgment and thought content.    Assessment and Plan:  Pregnancy: G3P2002 at [redacted]w[redacted]d  1. Supervision of other normal pregnancy, antepartum Doing well with normal fetal movement.   2. [redacted] weeks gestation of pregnancy Getting labs/GTT completed on 6/1.   3. History of cesarean section x 2 Planning for repeat.   4. Abnormal fetal ultrasound Absent nasal bone (isolated finding with LR NIPS) and evaluated by MFM-- reassured likely normal variant. Rescheduled for follow up US on 6/20 (was not able to have her [place] appt completed today due to having her children with  her).   Preterm labor symptoms and general obstetric precautions including but not limited to vaginal bleeding, contractions, leaking of fluid and fetal movement were reviewed in detail with the patient. Please refer to After Visit Summary for other counseling recommendations.   Return in about 2 weeks (around 01/12/2022) + lab visit on 6/1.   Patriciaann Clan, DO

## 2021-12-31 ENCOUNTER — Other Ambulatory Visit: Payer: Self-pay

## 2021-12-31 ENCOUNTER — Other Ambulatory Visit: Payer: BC Managed Care – PPO

## 2021-12-31 DIAGNOSIS — Z3480 Encounter for supervision of other normal pregnancy, unspecified trimester: Secondary | ICD-10-CM

## 2022-01-01 LAB — GLUCOSE TOLERANCE, 2 HOURS W/ 1HR
Glucose, 1 hour: 94 mg/dL (ref 70–179)
Glucose, 2 hour: 57 mg/dL — ABNORMAL LOW (ref 70–152)
Glucose, Fasting: 69 mg/dL — ABNORMAL LOW (ref 70–91)

## 2022-01-01 LAB — CBC
Hematocrit: 34 % (ref 34.0–46.6)
Hemoglobin: 11.4 g/dL (ref 11.1–15.9)
MCH: 28.6 pg (ref 26.6–33.0)
MCHC: 33.5 g/dL (ref 31.5–35.7)
MCV: 85 fL (ref 79–97)
Platelets: 174 10*3/uL (ref 150–450)
RBC: 3.99 x10E6/uL (ref 3.77–5.28)
RDW: 11.9 % (ref 11.7–15.4)
WBC: 9.3 10*3/uL (ref 3.4–10.8)

## 2022-01-01 LAB — HIV ANTIBODY (ROUTINE TESTING W REFLEX): HIV Screen 4th Generation wRfx: NONREACTIVE

## 2022-01-01 LAB — RPR: RPR Ser Ql: NONREACTIVE

## 2022-01-02 ENCOUNTER — Telehealth: Payer: Self-pay | Admitting: Urgent Care

## 2022-01-02 DIAGNOSIS — N760 Acute vaginitis: Secondary | ICD-10-CM

## 2022-01-02 NOTE — Progress Notes (Signed)
For the safety of you and your child, I recommend a face to face office visit with a health care provider.  You indicated pain with urinating, and are currently pregnant. All UTI-like symptoms should be evaluated in person during pregnancy.  Many mothers need to take medicines during their pregnancy and while nursing.  Almost all medicines pass into the breast milk in small quantities.  Most are generally considered safe for a mother to take but some medicines must be avoided.  After reviewing your E-Visit request, I recommend that you consult your OB/GYN or pediatrician for medical advice in relation to your condition and prescription medications while pregnant or breastfeeding.  NOTE:  There will be NO CHARGE for this eVisit  If you are having a true medical emergency please call 911.    For an urgent face to face visit, [facility] has six urgent care centers for your convenience:     [facility] [place] [facility] [place] Get Driving Directions [id number] [place], [place] [address]    [facility]) Get Driving Directions [id number] [place], [place] [address]  [facility]) Get Driving Directions [id number] [place],  [place]  [address]  [facility] Get Driving Directions [id number] [place], [place], [place] [address]   [facility] Get Driving Directions  [id number] [address] [place], [place] [address]   [facility] [place] Get Driving Directions [id number] [address] [place],  [address]  Your MyChart E-visit questionnaire answers were reviewed by a board certified advanced clinical practitioner to complete your personal care plan based on your specific symptoms.  Thank you for  using e-Visits.

## 2022-01-13 ENCOUNTER — Ambulatory Visit (INDEPENDENT_AMBULATORY_CARE_PROVIDER_SITE_OTHER): Payer: BC Managed Care – PPO | Admitting: Obstetrics and Gynecology

## 2022-01-13 VITALS — BP 126/69 | HR 91 | Wt 193.2 lb

## 2022-01-13 DIAGNOSIS — Z98891 History of uterine scar from previous surgery: Secondary | ICD-10-CM

## 2022-01-13 DIAGNOSIS — Z3A30 30 weeks gestation of pregnancy: Secondary | ICD-10-CM

## 2022-01-13 DIAGNOSIS — O283 Abnormal ultrasonic finding on antenatal screening of mother: Secondary | ICD-10-CM

## 2022-01-13 DIAGNOSIS — Z3480 Encounter for supervision of other normal pregnancy, unspecified trimester: Secondary | ICD-10-CM

## 2022-01-13 NOTE — Progress Notes (Signed)
PRENATAL VISIT NOTE  Subjective:  Wendy Aguilar is a 21 y.o. G3P2002 at [redacted]w[redacted]d being seen today for ongoing prenatal care.  She is currently monitored for the following issues for this high-risk pregnancy and has History of cesarean section x 2; Supervision of other normal pregnancy, antepartum; and Abnormal fetal ultrasound on their problem list.  Patient doing well with no acute concerns today. She reports no complaints.  Contractions: Not present. Vag. Bleeding: None.  Movement: Present. Denies leaking of fluid.   The following portions of the patient's history were reviewed and updated as appropriate: allergies, current medications, past family history, past medical history, past social history, past surgical history and problem list. Problem list updated.  Objective:   Vitals:   01/13/22 0846  BP: 126/69  Pulse: 91  Weight: 193 lb 3.2 oz (87.6 kg)    Fetal Status: Fetal Heart Rate (bpm): 146 Fundal Height: 30 cm Movement: Present     General:  Alert, oriented and cooperative. Patient is in no acute distress.  Skin: Skin is warm and dry. No rash noted.   Cardiovascular: Normal heart rate noted  Respiratory: Normal respiratory effort, no problems with respiration noted  Abdomen: Soft, gravid, appropriate for gestational age.  Pain/Pressure: Present     Pelvic: Cervical exam deferred        Extremities: Normal range of motion.  Edema: None  Mental Status:  Normal mood and affect. Normal behavior. Normal judgment and thought content.  Faculty Practice OB/GYN Attending Consult Note  21 y.o. V6P2244 at [redacted]w[redacted]d with Estimated Date of Delivery: 03/20/22 was seen today in office to discuss trial of labor after cesarean section (TOLAC) versus elective repeat cesarean delivery (ERCD). The following risks were discussed with the patient.  Risk of uterine rupture at term is 0.78 percent with TOLAC and 0.22 percent with ERCD. 1 in 10 uterine ruptures will result in neonatal death or  neurological injury. The benefits of a trial of labor after cesarean (TOLAC) resulting in a vaginal birth after cesarean (VBAC) include the following: shorter length of hospital stay and postpartum recovery (in most cases); fewer complications, such as postpartum fever, wound or uterine infection, thromboembolism (blood clots in the leg or lung), need for blood transfusion and fewer neonatal breathing problems. The risks of an attempted VBAC or TOLAC include the following: Risk of failed trial of labor after cesarean (TOLAC) without a vaginal birth after cesarean (VBAC) resulting in repeat cesarean delivery (RCD) in about 20 to 40 percent of women who attempt VBAC.  Risk of rupture of uterus resulting in an emergency cesarean delivery. The risk of uterine rupture may be related in part to the type of uterine incision made during the first cesarean delivery. A previous transverse uterine incision has the lowest risk of rupture (0.2 to 1.5 percent risk). Vertical or T-shaped uterine incisions have a higher risk of uterine rupture (4 to 9 percent risk)The risk of fetal death is very low with both VBAC and elective repeat cesarean delivery (ERCD), but the likelihood of fetal death is higher with VBAC than with ERCD. Maternal death is very rare with either type of delivery. The risks of an elective repeat cesarean delivery (ERCD) were reviewed with the patient including but not limited to: 09/998 risk of uterine rupture which could have serious consequences, bleeding which may require transfusion; infection which may require antibiotics; injury to bowel, bladder or other surrounding organs (bowel, bladder, ureters); injury to the fetus; need for additional procedures including hysterectomy  in the event of a life-threatening hemorrhage; thromboembolic phenomenon; abnormal placentation; incisional problems; death and other postoperative or anesthesia complications.    These risks and benefits are summarized on the  consent form, which was reviewed with the patient during the visit.  All her questions answered and she signed a consent indicating a preference for TOLAC/ERCD. A copy of the consent was given to the patient.  Pt desires TOLAC.  She understands she will not be induced due to her 2 previous c sections.  Previous op note reviewed and is in the chart.  If undelivered by 40 weeks, pt is scheduled for repeat c section then.   Assessment and Plan:  Pregnancy: G3P2002 at [redacted]w[redacted]d  1. Supervision of other normal pregnancy, antepartum Continue routine care  2. [redacted] weeks gestation of pregnancy   3. History of cesarean section x 2 If pt is in active labor she desires TOLAC, if undelivered by due date pt will have repeat cesarean section at 40 weeks  4. Abnormal fetal ultrasound Due to absent nasal bone  Preterm labor symptoms and general obstetric precautions including but not limited to vaginal bleeding, contractions, leaking of fluid and fetal movement were reviewed in detail with the patient.  Please refer to After Visit Summary for other counseling recommendations.   Return in about 2 weeks (around 01/27/2022) for ROB, in person.   Lynnda Shields, MD Faculty Attending Center for [facility]

## 2022-01-19 ENCOUNTER — Ambulatory Visit: Payer: BC Managed Care – PPO | Attending: Obstetrics

## 2022-01-19 ENCOUNTER — Ambulatory Visit: Payer: BC Managed Care – PPO | Admitting: *Deleted

## 2022-01-19 ENCOUNTER — Encounter: Payer: Self-pay | Admitting: *Deleted

## 2022-01-19 VITALS — BP 109/68 | HR 95

## 2022-01-19 DIAGNOSIS — O34219 Maternal care for unspecified type scar from previous cesarean delivery: Secondary | ICD-10-CM | POA: Diagnosis not present

## 2022-01-19 DIAGNOSIS — Z3480 Encounter for supervision of other normal pregnancy, unspecified trimester: Secondary | ICD-10-CM | POA: Insufficient documentation

## 2022-01-19 DIAGNOSIS — O35AXX0 Maternal care for other (suspected) fetal abnormality and damage, fetal facial anomalies, not applicable or unspecified: Secondary | ICD-10-CM

## 2022-01-19 DIAGNOSIS — Q758 Other specified congenital malformations of skull and face bones: Secondary | ICD-10-CM | POA: Insufficient documentation

## 2022-01-19 DIAGNOSIS — Z362 Encounter for other antenatal screening follow-up: Secondary | ICD-10-CM | POA: Insufficient documentation

## 2022-01-19 DIAGNOSIS — O289 Unspecified abnormal findings on antenatal screening of mother: Secondary | ICD-10-CM | POA: Insufficient documentation

## 2022-01-19 DIAGNOSIS — Z3A31 31 weeks gestation of pregnancy: Secondary | ICD-10-CM

## 2022-01-27 ENCOUNTER — Ambulatory Visit (INDEPENDENT_AMBULATORY_CARE_PROVIDER_SITE_OTHER): Payer: BC Managed Care – PPO | Admitting: Family Medicine

## 2022-01-27 VITALS — BP 113/69 | HR 86 | Wt 193.0 lb

## 2022-01-27 DIAGNOSIS — Z98891 History of uterine scar from previous surgery: Secondary | ICD-10-CM

## 2022-01-27 DIAGNOSIS — Z3A32 32 weeks gestation of pregnancy: Secondary | ICD-10-CM

## 2022-01-27 DIAGNOSIS — O283 Abnormal ultrasonic finding on antenatal screening of mother: Secondary | ICD-10-CM

## 2022-01-27 DIAGNOSIS — B379 Candidiasis, unspecified: Secondary | ICD-10-CM

## 2022-01-27 DIAGNOSIS — Z3480 Encounter for supervision of other normal pregnancy, unspecified trimester: Secondary | ICD-10-CM

## 2022-01-27 MED ORDER — FLUCONAZOLE 150 MG PO TABS: 150.0000 mg | ORAL_TABLET | Freq: Once | ORAL | 0 refills | Status: AC

## 2022-01-27 NOTE — Progress Notes (Signed)
   Subjective:  Wendy Aguilar is a 21 y.o. G3P2002 at [redacted]w[redacted]d being seen today for ongoing prenatal care.  She is currently monitored for the following issues for this low-risk pregnancy and has History of cesarean section x 2; Supervision of other normal pregnancy, antepartum; and Abnormal fetal ultrasound on their problem list.  Patient reports no complaints.  Contractions: Irritability. Vag. Bleeding: None.  Movement: Present. Denies leaking of fluid.   The following portions of the patient's history were reviewed and updated as appropriate: allergies, current medications, past family history, past medical history, past social history, past surgical history and problem list. Problem list updated.  Objective:   Vitals:   01/27/22 1347  BP: 113/69  Pulse: 86  Weight: 193 lb (87.5 kg)    Fetal Status: Fetal Heart Rate (bpm): 147   Movement: Present     General:  Alert, oriented and cooperative. Patient is in no acute distress.  Skin: Skin is warm and dry. No rash noted.   Cardiovascular: Normal heart rate noted  Respiratory: Normal respiratory effort, no problems with respiration noted  Abdomen: Soft, gravid, appropriate for gestational age. Pain/Pressure: Absent     Pelvic: Vag. Bleeding: None     Cervical exam deferred        Extremities: Normal range of motion.     Mental Status: Normal mood and affect. Normal behavior. Normal judgment and thought content.   Urinalysis:      Assessment and Plan:  Pregnancy: G3P2002 at [redacted]w[redacted]d  1. Supervision of other normal pregnancy, antepartum BP and FHR normal Symptoms of yeast vaginitis  2. History of cesarean section x 2 Desires TOLAC, consent signed with Dr. Donavan Foil last visit  3. Abnormal fetal ultrasound Absent nasal bone, normal NIPT, normal [place] otherwise  Preterm labor symptoms and general obstetric precautions including but not limited to vaginal bleeding, contractions, leaking of fluid and fetal movement were reviewed in detail  with the patient. Please refer to After Visit Summary for other counseling recommendations.  Return in 2 weeks (on 02/10/2022).   Venora Maples, MD

## 2022-01-27 NOTE — Patient Instructions (Signed)

## 2022-02-09 NOTE — Progress Notes (Signed)
Attempted to connect with patient and she was unable to connect, visit rescheduled.

## 2022-02-10 ENCOUNTER — Telehealth: Payer: BC Managed Care – PPO | Admitting: Obstetrics and Gynecology

## 2022-02-13 ENCOUNTER — Inpatient Hospital Stay (HOSPITAL_COMMUNITY)
Admission: AD | Admit: 2022-02-13 | Discharge: 2022-02-13 | Disposition: A | Discharge status: Home / Self Care | Payer: BC Managed Care – PPO | Attending: Obstetrics & Gynecology | Admitting: Obstetrics & Gynecology

## 2022-02-13 ENCOUNTER — Encounter (HOSPITAL_COMMUNITY): Payer: Self-pay | Admitting: Obstetrics & Gynecology

## 2022-02-13 DIAGNOSIS — Z3689 Encounter for other specified antenatal screening: Secondary | ICD-10-CM

## 2022-02-13 DIAGNOSIS — Z3A35 35 weeks gestation of pregnancy: Secondary | ICD-10-CM | POA: Insufficient documentation

## 2022-02-13 DIAGNOSIS — B9689 Other specified bacterial agents as the cause of diseases classified elsewhere: Secondary | ICD-10-CM

## 2022-02-13 DIAGNOSIS — O98813 Other maternal infectious and parasitic diseases complicating pregnancy, third trimester: Secondary | ICD-10-CM | POA: Diagnosis not present

## 2022-02-13 DIAGNOSIS — M549 Dorsalgia, unspecified: Secondary | ICD-10-CM | POA: Insufficient documentation

## 2022-02-13 DIAGNOSIS — O26893 Other specified pregnancy related conditions, third trimester: Secondary | ICD-10-CM | POA: Insufficient documentation

## 2022-02-13 DIAGNOSIS — B3731 Acute candidiasis of vulva and vagina: Secondary | ICD-10-CM | POA: Insufficient documentation

## 2022-02-13 DIAGNOSIS — O4703 False labor before 37 completed weeks of gestation, third trimester: Secondary | ICD-10-CM | POA: Diagnosis present

## 2022-02-13 LAB — URINALYSIS, ROUTINE W REFLEX MICROSCOPIC
Bilirubin Urine: NEGATIVE
Glucose, UA: NEGATIVE mg/dL
Hgb urine dipstick: NEGATIVE
Ketones, ur: NEGATIVE mg/dL
Leukocytes,Ua: NEGATIVE
Nitrite: NEGATIVE
Protein, ur: NEGATIVE mg/dL
Specific Gravity, Urine: 1.011 (ref 1.005–1.030)
pH: 7 (ref 5.0–8.0)

## 2022-02-13 LAB — WET PREP, GENITAL
Sperm: NONE SEEN
Trich, Wet Prep: NONE SEEN
WBC, Wet Prep HPF POC: >=10 — AB (ref <10)

## 2022-02-13 MED ORDER — CYCLOBENZAPRINE HCL 5 MG PO TABS
10.0000 mg | ORAL_TABLET | Freq: Once | ORAL | Status: AC
  Administered 2022-02-13: 10 mg via ORAL
  Filled 2022-02-13: qty 2

## 2022-02-13 MED ORDER — METRONIDAZOLE 0.75 % VA GEL
1.0000 | Freq: Every day | VAGINAL | 0 refills | Status: DC
Start: 2022-02-13 — End: 2022-03-10

## 2022-02-13 MED ORDER — ACETAMINOPHEN 500 MG PO TABS
1000.0000 mg | ORAL_TABLET | Freq: Once | ORAL | Status: AC
  Administered 2022-02-13: 1000 mg via ORAL
  Filled 2022-02-13: qty 2

## 2022-02-13 NOTE — MAU Note (Addendum)
Wendy Aguilar is a 21 y.o. at [redacted]w[redacted]d here in MAU reporting: ctx back to back since last night. Was at dinner tonight and "my back was on fire" reports ctx are every couple of minutes. Rates pain 8 on 0-10 scales; states it's a lot of pressure. Denies VB or LOF. States she has noticed mucous like discharge. +FM. Hx of c/s x2 - both for fetal intolerance. Pt desires VBAC.   Onset of complaint: 2300 on 7/14 Pain score: 8 Vitals:   02/13/22 2011  BP: 132/74  Pulse: (!) 101  Resp: 19  Temp: 98.6 F (37 C)  SpO2: 100%    FHT:165 Lab orders placed from triage: u/a.

## 2022-02-13 NOTE — MAU Provider Note (Signed)
History     CSN: [id number]  Arrival date and time: 02/13/22 1956   Event Date/Time   First Provider Initiated Contact with Patient 02/13/22 2044      Chief Complaint  Patient presents with   Contractions   Wendy Aguilar is a 21 y.o. G3P2002 at [redacted]w[redacted]d who receives care at [facility].  She presents today for Contractions.  She states she started having back pain yesterday and it has gotten progressively worse.  She states it was located in her lower back, but then points to her mid back in the spine area.  She states it "feels like it is getting stronger" and describes it as a sharp stabbing sensation.  She also reports some abdominal tightening that is constant. She states the pain makes "my breathing feel funny." She denies aggravating or relieving factors and rates her pain a 8/10.  She endorses fetal movement and some vaginal discharge, but denies bleeding. She denies recent sexual activity.     OB History     Gravida  3   Para  2   Term  2   Preterm  0   AB  0   Living  2      SAB  0   IAB  0   Ectopic  0   Multiple  0   Live Births  2           Past Medical History:  Diagnosis Date   Medical history non-contributory     Past Surgical History:  Procedure Laterality Date   CESAREAN SECTION N/A 10/10/2016   Procedure: CESAREAN SECTION;  Surgeon: Adam Phenix, MD;  Location: [facility] BIRTHING SUITES;  Service: Obstetrics;  Laterality: N/A;   CESAREAN SECTION N/A 03/19/2018   Procedure: CESAREAN SECTION;  Surgeon: Conan Bowens, MD;  Location: [facility] BIRTHING SUITES;  Service: Obstetrics;  Laterality: N/A;    Family History  Problem Relation Age of Onset   Hypertension Mother    Alcohol abuse Neg Hx    Arthritis Neg Hx    Asthma Neg Hx    Birth defects Neg Hx    COPD Neg Hx    Depression Neg Hx    Diabetes Neg Hx    Drug abuse Neg Hx    Early death Neg Hx    Hearing loss Neg Hx    Heart disease Neg Hx    Hyperlipidemia Neg Hx    Kidney disease Neg  Hx    Learning disabilities Neg Hx    Mental illness Neg Hx    Mental retardation Neg Hx    Miscarriages / Stillbirths Neg Hx    Stroke Neg Hx    Vision loss Neg Hx    Varicose Veins Neg Hx     Social History   Tobacco Use   Smoking status: Never   Smokeless tobacco: Never  Vaping Use   Vaping Use: Never used  Substance Use Topics   Alcohol use: Not Currently    Comment: not while preg   Drug use: No    Allergies: No Known Allergies  Medications Prior to Admission  Medication Sig Dispense Refill Last Dose   prenatal vitamin w/FE, FA (PRENATAL 1 + 1) 27-1 MG TABS tablet Take 1 tablet by mouth daily at 12 noon. 30 tablet 11 02/12/2022    Review of Systems  Constitutional:  Negative for chills and fever.  Gastrointestinal:  Positive for abdominal pain. Negative for constipation, diarrhea, nausea and vomiting.  Genitourinary:  Positive for vaginal discharge (White discharge). Negative for difficulty urinating, dysuria and vaginal bleeding.  Neurological:  Positive for headaches. Negative for dizziness and light-headedness.   Physical Exam   Blood pressure 132/74, pulse (!) 101, temperature 98.6 F (37 C), temperature source Oral, resp. rate 19, height 5\' 9"  (1.753 m), weight 90.8 kg, last menstrual period 05/31/2021, SpO2 100 %.  Physical Exam Vitals reviewed. Exam conducted with a chaperone present.  Constitutional:      Appearance: Normal appearance.     Comments: Appears tired  HENT:     Head: Normocephalic and atraumatic.  Eyes:     Conjunctiva/sclera: Conjunctivae normal.  Cardiovascular:     Rate and Rhythm: Normal rate.  Pulmonary:     Effort: Pulmonary effort is normal. No respiratory distress.  Genitourinary:    Comments: Speculum Exam: -Normal External Genitalia: Non tender, scant amt watery gray discharge at introitus.  -Vaginal Vault: Inflamed mucosa with white patches, but good rugae. Small amt white curdy and watery gray discharge -wet prep  collected -Cervix:Pink, no lesions, cysts, or polyps.  Appears closed. No active bleeding from os-GC/CT collected -Bimanual Exam:  Closed Musculoskeletal:        General: Normal range of motion.     Cervical back: Normal range of motion.  Skin:    General: Skin is warm and dry.  Neurological:     Mental Status: She is alert and oriented to person, place, and time.  Psychiatric:        Mood and Affect: Mood normal.        Behavior: Behavior normal.     Fetal Assessment 150 bpm, Mod Var, -Decels, +Accels Toco: Irregular Contractions  MAU Course   Results for orders placed or performed during the hospital encounter of 02/13/22 (from the past 24 hour(s))  Urinalysis, Routine w reflex microscopic Urine, Clean Catch     Status: None   Collection Time: 02/13/22  8:26 PM  Result Value Ref Range   Color, Urine YELLOW YELLOW   APPearance CLEAR CLEAR   Specific Gravity, Urine 1.011 1.005 - 1.030   pH 7.0 5.0 - 8.0   Glucose, UA NEGATIVE NEGATIVE mg/dL   Hgb urine dipstick NEGATIVE NEGATIVE   Bilirubin Urine NEGATIVE NEGATIVE   Ketones, ur NEGATIVE NEGATIVE mg/dL   Protein, ur NEGATIVE NEGATIVE mg/dL   Nitrite NEGATIVE NEGATIVE   Leukocytes,Ua NEGATIVE NEGATIVE  Wet prep, genital     Status: Abnormal   Collection Time: 02/13/22  8:55 PM  Result Value Ref Range   Yeast Wet Prep HPF POC PRESENT (A) NONE SEEN   Trich, Wet Prep NONE SEEN NONE SEEN   Clue Cells Wet Prep HPF POC PRESENT (A) NONE SEEN   WBC, Wet Prep HPF POC >=10 (A) <10   Sperm NONE SEEN    No results found.  MDM PE Labs: Wet prep, GC/CT EFM Muscle Relaxant  Assessment and Plan  21 year old G3P2002  SIUP at 35 weeks Cat I FT Back Pain Contractions Candidiasis of Vagina  -Exam performed and findings discussed. -Informed that findings suggestive of yeast infection. -Patient reports she had a prescription sent, at her last appt, but never picked it up. -Instructed to do so.  -Cultures collected and sent.   -Patient offered and accepts pain medication. -Will also give heating pad for back pain. -Monitor and await results.  -NST reactive   21 MSN, CNM 02/13/2022, 8:44 PM   Reassessment (9:40 PM)  -Results as above. -NST remains  reactive -Provider to bedside to discuss. -Informed of findings and treatment plan. Patient agreeable. -Patient reports continued HA despite medications. -Offered additional meds and/or interventions and patient declines.  States she thinks she is just tired. -Instructed to go home and rest and if HA remains in AM to return for evaluation. -No other questions. -Rx for metrogel sent to pharmacy on file. Instructed to abstain from sexual activity until treatment complete as vaginal inflammation may cause discomfort. Patient verbalizes understanding.  -Encouraged to call primary office or return to MAU if symptoms worsen or with the onset of new symptoms. -Discharged to home in stable condition.  Cherre Robins MSN, CNM Advanced Practice Provider, Center for [employer]

## 2022-02-15 LAB — GC/CHLAMYDIA PROBE AMP (~~LOC~~) NOT AT ARMC
Chlamydia: NEGATIVE
Comment: NEGATIVE
Comment: NORMAL
Neisseria Gonorrhea: NEGATIVE

## 2022-02-16 ENCOUNTER — Encounter: Payer: BC Managed Care – PPO | Admitting: Advanced Practice Midwife

## 2022-02-22 NOTE — Progress Notes (Signed)
   PRENATAL VISIT NOTE  Subjective:  Wendy Aguilar is a 21 y.o. G3P2002 at [redacted]w[redacted]d being seen today for ongoing prenatal care.  She is currently monitored for the following issues for this low-risk pregnancy and has History of cesarean section x 2; Supervision of other normal pregnancy, antepartum; and Abnormal fetal ultrasound on their problem list.  Patient reports no complaints.  Contractions: Irritability. Vag. Bleeding: None.  Movement: Present. Denies leaking of fluid.   The following portions of the patient's history were reviewed and updated as appropriate: allergies, current medications, past family history, past medical history, past social history, past surgical history and problem list.   Objective:   Vitals:   02/24/22 1453  BP: (!) 110/59  Pulse: 90  Weight: 201 lb (91.2 kg)    Fetal Status: Fetal Heart Rate (bpm): 150 Fundal Height: 36 cm Movement: Present     General:  Alert, oriented and cooperative. Patient is in no acute distress.  Skin: Skin is warm and dry. No rash noted.   Cardiovascular: Normal heart rate noted  Respiratory: Normal respiratory effort, no problems with respiration noted  Abdomen: Soft, gravid, appropriate for gestational age.  Pain/Pressure: Present     Pelvic: Cervical exam performed in the presence of a chaperone        Extremities: Normal range of motion.  Edema: None  Mental Status: Normal mood and affect. Normal behavior. Normal judgment and thought content.   Assessment and Plan:  Pregnancy: G3P2002 at [redacted]w[redacted]d 1. History of cesarean section x 2 Desires TOLAC but has CD scheduled for 40w if no labor by that point.   2. Supervision of other normal pregnancy, antepartum GBS done today  GC/CT recently done in MAU and negative Had yeast and BV in MAU but only metrogel sent. She would prefer PO flagyl and terconazole. Rxs sent.   3. Abnormal fetal ultrasound Absent nasal bone, but LR NIPS and normal [place] otherwise  Preterm labor symptoms  and general obstetric precautions including but not limited to vaginal bleeding, contractions, leaking of fluid and fetal movement were reviewed in detail with the patient. Please refer to After Visit Summary for other counseling recommendations.   Return in about 1 week (around 03/03/2022) for OB VISIT, MD or APP.  Future Appointments  Date Time Provider Department Center  03/03/2022  1:55 PM Federico Flake, MD [facility]  03/10/2022  3:15 PM Warden Fillers, MD [facility]  03/17/2022  3:15 PM [place] Bing, MD [facility]  03/24/2022  1:15 PM [place] Bing, MD [facility]    Milas Hock, MD

## 2022-02-24 ENCOUNTER — Other Ambulatory Visit: Payer: Self-pay

## 2022-02-24 ENCOUNTER — Encounter: Payer: Self-pay | Admitting: Obstetrics and Gynecology

## 2022-02-24 ENCOUNTER — Ambulatory Visit (INDEPENDENT_AMBULATORY_CARE_PROVIDER_SITE_OTHER): Payer: BC Managed Care – PPO | Admitting: Obstetrics and Gynecology

## 2022-02-24 VITALS — BP 110/59 | HR 90 | Wt 201.0 lb

## 2022-02-24 DIAGNOSIS — N760 Acute vaginitis: Secondary | ICD-10-CM

## 2022-02-24 DIAGNOSIS — B9689 Other specified bacterial agents as the cause of diseases classified elsewhere: Secondary | ICD-10-CM

## 2022-02-24 DIAGNOSIS — Z3480 Encounter for supervision of other normal pregnancy, unspecified trimester: Secondary | ICD-10-CM

## 2022-02-24 DIAGNOSIS — B379 Candidiasis, unspecified: Secondary | ICD-10-CM

## 2022-02-24 DIAGNOSIS — O283 Abnormal ultrasonic finding on antenatal screening of mother: Secondary | ICD-10-CM

## 2022-02-24 DIAGNOSIS — Z98891 History of uterine scar from previous surgery: Secondary | ICD-10-CM

## 2022-02-24 MED ORDER — TERCONAZOLE 0.8 % VA CREA: 1.0000 | TOPICAL_CREAM | Freq: Every day | VAGINAL | 0 refills | Status: DC

## 2022-02-24 MED ORDER — METRONIDAZOLE 500 MG PO TABS: 500.0000 mg | ORAL_TABLET | Freq: Two times a day (BID) | ORAL | 0 refills | Status: DC

## 2022-02-27 LAB — CULTURE, BETA STREP (GROUP B ONLY): Strep Gp B Culture: POSITIVE — AB

## 2022-03-03 ENCOUNTER — Telehealth (INDEPENDENT_AMBULATORY_CARE_PROVIDER_SITE_OTHER): Payer: BC Managed Care – PPO | Admitting: Family Medicine

## 2022-03-03 DIAGNOSIS — O34219 Maternal care for unspecified type scar from previous cesarean delivery: Secondary | ICD-10-CM

## 2022-03-03 DIAGNOSIS — Z3A00 Weeks of gestation of pregnancy not specified: Secondary | ICD-10-CM

## 2022-03-03 DIAGNOSIS — O283 Abnormal ultrasonic finding on antenatal screening of mother: Secondary | ICD-10-CM

## 2022-03-03 DIAGNOSIS — Z98891 History of uterine scar from previous surgery: Secondary | ICD-10-CM

## 2022-03-03 DIAGNOSIS — Z3480 Encounter for supervision of other normal pregnancy, unspecified trimester: Secondary | ICD-10-CM

## 2022-03-03 NOTE — Progress Notes (Signed)
   OBSTETRICS PRENATAL VIRTUAL VISIT ENCOUNTER NOTE  Provider location: Center for [facility] at [facility]   Patient location: Home  I connected with Wendy Aguilar on 03/03/22 at  1:55 PM EDT by MyChart Video Encounter and verified that I am speaking with the correct person using two identifiers. I discussed the limitations, risks, security and privacy concerns of performing an evaluation and management service virtually and the availability of in person appointments. I also discussed with the patient that there may be a patient responsible charge related to this service. The patient expressed understanding and agreed to proceed. Subjective:  Wendy Aguilar is a 21 y.o. G3P2002 at [redacted]w[redacted]d being seen today for ongoing prenatal care.  She is currently monitored for the following issues for this low-risk pregnancy and has History of cesarean section x 2; Supervision of other normal pregnancy, antepartum; and Abnormal fetal ultrasound on their problem list.  Patient reports no complaints.  Contractions: Irregular. Vag. Bleeding: None.  Movement: Present. Denies any leaking of fluid.   The following portions of the patient's history were reviewed and updated as appropriate: allergies, current medications, past family history, past medical history, past social history, past surgical history and problem list.   Objective:  There were no vitals filed for this visit.  Fetal Status:     Movement: Present     General:  Alert, oriented and cooperative. Patient is in no acute distress.  Respiratory: Normal respiratory effort, no problems with respiration noted  Mental Status: Normal mood and affect. Normal behavior. Normal judgment and thought content.  Rest of physical exam deferred due to type of encounter  Imaging: No results found.  Assessment and Plan:  Pregnancy: G3P2002 at [redacted]w[redacted]d 1. Supervision of other normal pregnancy, antepartum Up to date Reviewed next visit in  person with possible cervical exam check  2. Abnormal fetal ultrasound  3. History of cesarean section x 2 Desires TOLAC strongly Reviewed that if SOL we could consider augmentation. Reviewed her prior labor experiences which were both induced and ended in CS for NRFHT CS scheduled at 8/18 @40wks   Preterm labor symptoms and general obstetric precautions including but not limited to vaginal bleeding, contractions, leaking of fluid and fetal movement were reviewed in detail with the patient. I discussed the assessment and treatment plan with the patient. The patient was provided an opportunity to ask questions and all were answered. The patient agreed with the plan and demonstrated an understanding of the instructions. The patient was advised to call back or seek an in-person office evaluation/go to [facility]U at [facility] for any urgent or concerning symptoms. Please refer to After Visit Summary for other counseling recommendations.   I provided 15 minutes of face-to-face time during this encounter.  Return in about 1 week (around 03/10/2022) for Routine prenatal care.  Future Appointments  Date Time Provider Department Center  03/10/2022  3:15 PM 05/10/2022, MD [facility]  03/17/2022  3:15 PM 03/19/2022, MD [facility]  03/24/2022  1:15 PM 03/26/2022, MD [facility]    [facility] MD Center for [facility], [facility]

## 2022-03-03 NOTE — Patient Instructions (Signed)
These supplements and herbs are available over the counter without a prescription. They are often in the vitamin section of a pharmacy.   For pregnancy Blood Builder Magnesium - get Calm drink or gummies __________________________________________________________________________________________  To help ripen your Cervix/prepare the uterus (to get your cervix ready for labor):   Red Raspberry Leaf capsules:  two 300mg or 400mg tablets with each meal, 2-3 times a day  Potential Side Effects Of Raspberry Leaf:  Most women do not experience any side effects from drinking raspberry leaf tea. However, nausea and loose stools are possible. This can cause uterine irritability. If you notice having many braxton hicks contractions, stop this supplement.    Evening Primrose Oil capsules: may take 1 to 3 capsules daily. May also prick one to release the oil and insert it into your vagina at night.  One regimen would be to take 1 tablets three times per day and place 2 tablets in the vagina at night.   Some of the potential side effects:  Upset stomach  Loose stools or diarrhea  Headaches  Nausea   _____________________________________________________________________________________________  For Labor: All of these can be use in the last trimester of pregnancy  5-6 Dates a day -- This can help shorten your labor (may taste better if warmed in microwave until soft). This can also be combined with almond butter, any other nut butter, or wrap in bacon and bake, or toss in smoothies. Can also eat Lara bars. Found where raisins are in the grocery store   ______________________________________________________________________________________________  For Breastfeeding:   NOTHING WILL WORK UNLESS YOU ARE  - Emptying the breast adequately/completely - Emptying the breast regularly  Supplement/Herb Purpose Dose Side Effects  Vitamin D Increase Vitamin D to infant, recommended for all breastfeeding  moms and can use instead of supplementing infant 4000 IU daily None  Fenugreek** use with extreme caution as this can decrease milk supply in some  Increases prolactin  400mg TID (max) Nausea, Loose stools and smelling like maple syrup  Goat's Rue Help increase differentiation of breast tissue, good for women with suspected IGT. Helps with insulin sensitvity 1 capsule BID Nausea, loose stools  Legendairy Milk Supplements -PumpPrincess -Liquid Gold -Cash Cow -Milkapalooza  Combination of herbal galactogues  Per packaging Per packaging  Lactation cookies/bars Food based galactogues/increase maternal calories All the time, q2-3 hours    Flax seeds Food based Galactogue Daily GI upset, nausea  Brewer's yeast Food based Galactogue Daily GI upset, nausea  Hemp Hearts Food based Galactogue Daily GI upset, nausea  Oats Food based Galactogue Daily   Lecithin (Soy or Sunflower) Decreases the viscosity of milk, galactogue, fat emulsifier  Good for oversupply mothers to prevent clogged ducts 1200mg three times per day GI upset  Rehydration drinks (Gatorade/Powerade) Improves maternal hydration and mammary glands are histologically similar to sweat glands  None, caution use in patients with T2DM     

## 2022-03-03 NOTE — Progress Notes (Signed)
I connected with  Wendy Aguilar on 03/03/22 at  1:55 PM EDT virtually and verified that I am speaking with the correct person using two identifiers.Patient is in parked car. Nurse is in office.    I discussed the limitations, risks, security and privacy concerns of performing an evaluation and management service by telephone and the availability of in person appointments. I also discussed with the patient that there may be a patient responsible charge related to this service. The patient expressed understanding and agreed to proceed. Patient c/o baby moving well but less than before, discussed when to go to hospital.   [name] 03/03/2022  2:02 PM

## 2022-03-05 ENCOUNTER — Encounter (HOSPITAL_COMMUNITY): Payer: Self-pay

## 2022-03-05 NOTE — Patient Instructions (Signed)
Wendy Aguilar  03/05/2022   Your procedure is scheduled on:  03/19/2022  Arrive at 0730 at [name] [facility] on [address] at [facility]  and [employer]. You are invited to use the FREE valet parking or use the Visitor's parking deck.  Pick up the phone at the desk and dial [phone].  Call this number if you have problems the morning of surgery: [phone]  Remember:   Do not eat food:(After Midnight) Desps de medianoche.  Do not drink clear liquids: (After Midnight) Desps de medianoche.  Take these medicines the morning of surgery with A SIP OF WATER:  none   Do not wear jewelry, make-up or nail polish.  Do not wear lotions, powders, or perfumes. Do not wear deodorant.  Do not shave 48 hours prior to surgery.  Do not bring valuables to the hospital.  [facility] is not   responsible for any belongings or valuables brought to the hospital.  Contacts, dentures or bridgework may not be worn into surgery.  Leave suitcase in the car. After surgery it may be brought to your room.  For patients admitted to the hospital, checkout time is 11:00 AM the day of              discharge.      Please read over the following fact sheets that you were given:     Preparing for Surgery

## 2022-03-10 ENCOUNTER — Other Ambulatory Visit: Payer: Self-pay

## 2022-03-10 ENCOUNTER — Encounter: Payer: BC Managed Care – PPO | Admitting: Obstetrics and Gynecology

## 2022-03-10 ENCOUNTER — Ambulatory Visit (INDEPENDENT_AMBULATORY_CARE_PROVIDER_SITE_OTHER): Payer: BC Managed Care – PPO | Admitting: Certified Nurse Midwife

## 2022-03-10 VITALS — BP 113/75 | HR 86 | Wt 207.0 lb

## 2022-03-10 DIAGNOSIS — Z3A38 38 weeks gestation of pregnancy: Secondary | ICD-10-CM

## 2022-03-10 DIAGNOSIS — Z98891 History of uterine scar from previous surgery: Secondary | ICD-10-CM

## 2022-03-10 DIAGNOSIS — O0993 Supervision of high risk pregnancy, unspecified, third trimester: Secondary | ICD-10-CM

## 2022-03-10 NOTE — Progress Notes (Signed)
   PRENATAL VISIT NOTE  Subjective:  Wendy Aguilar is a 21 y.o. G3P2002 at [redacted]w[redacted]d being seen today for ongoing prenatal care.  She is currently monitored for the following issues for this high-risk pregnancy and has History of cesarean section x 2; Supervision of other normal pregnancy, antepartum; and Abnormal fetal ultrasound on their problem list.  Patient reports no complaints.  Contractions: Irritability. Vag. Bleeding: None.  Movement: Present. Denies leaking of fluid.   The following portions of the patient's history were reviewed and updated as appropriate: allergies, current medications, past family history, past medical history, past social history, past surgical history and problem list.   Objective:   Vitals:   03/10/22 1550  BP: 113/75  Pulse: 86  Weight: 207 lb (93.9 kg)    Fetal Status: Fetal Heart Rate (bpm): 146 Fundal Height: 38 cm Movement: Present  Presentation: Vertex  General:  Alert, oriented and cooperative. Patient is in no acute distress.  Skin: Skin is warm and dry. No rash noted.   Cardiovascular: Normal heart rate noted  Respiratory: Normal respiratory effort, no problems with respiration noted  Abdomen: Soft, gravid, appropriate for gestational age.  Pain/Pressure: Present     Pelvic: Cervical exam deferred        Extremities: Normal range of motion.  Edema: None  Mental Status: Normal mood and affect. Normal behavior. Normal judgment and thought content.   Assessment and Plan:  Pregnancy: G3P2002 at [redacted]w[redacted]d 1. Supervision of high risk pregnancy in third trimester - Doing well, feeling regular and vigorous fetal movement   2. [redacted] weeks gestation of pregnancy - Routine OB care   3. 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  - Dr. Vergie Living in the office so called to the room to meet the patient which relieved a lot of her anxiety.   Preterm labor symptoms and general obstetric precautions including but not limited to vaginal bleeding, contractions, leaking of fluid and fetal movement were reviewed in detail with the patient. Please refer to After Visit Summary for other counseling recommendations.   Return in about 7 weeks (around 04/28/2022) for PP.  Future Appointments  Date Time Provider Department Center  03/17/2022  9:30 AM [facility] PAT 1 [facility] None  03/17/2022  2:15 PM Venora Maples, MD [facility]  03/24/2022  1:15 PM Venora Maples, MD [facility]    Bernerd Limbo, CNM

## 2022-03-16 ENCOUNTER — Other Ambulatory Visit: Payer: Self-pay | Admitting: Obstetrics and Gynecology

## 2022-03-16 DIAGNOSIS — O34219 Maternal care for unspecified type scar from previous cesarean delivery: Secondary | ICD-10-CM | POA: Insufficient documentation

## 2022-03-16 DIAGNOSIS — Z01818 Encounter for other preprocedural examination: Secondary | ICD-10-CM

## 2022-03-16 MED ORDER — SOD CITRATE-CITRIC ACID 500-334 MG/5ML PO SOLN: 30.0000 mL | ORAL | Status: DC

## 2022-03-16 MED ORDER — CEFAZOLIN SODIUM-DEXTROSE 2-4 GM/100ML-% IV SOLN: 2.0000 g | INTRAVENOUS | Status: DC

## 2022-03-17 ENCOUNTER — Encounter: Payer: BC Managed Care – PPO | Admitting: Obstetrics and Gynecology

## 2022-03-17 ENCOUNTER — Encounter: Payer: BC Managed Care – PPO | Admitting: Family Medicine

## 2022-03-17 ENCOUNTER — Encounter (HOSPITAL_COMMUNITY)
Admission: RE | Admit: 2022-03-17 | Discharge: 2022-03-17 | Disposition: A | Discharge status: Home / Self Care | Payer: BC Managed Care – PPO | Source: Ambulatory Visit | Attending: Obstetrics and Gynecology | Admitting: Obstetrics and Gynecology

## 2022-03-17 VITALS — Ht 69.0 in | Wt 200.0 lb

## 2022-03-17 DIAGNOSIS — Z01812 Encounter for preprocedural laboratory examination: Secondary | ICD-10-CM | POA: Insufficient documentation

## 2022-03-17 DIAGNOSIS — Z3A00 Weeks of gestation of pregnancy not specified: Secondary | ICD-10-CM | POA: Insufficient documentation

## 2022-03-17 DIAGNOSIS — O34219 Maternal care for unspecified type scar from previous cesarean delivery: Secondary | ICD-10-CM | POA: Insufficient documentation

## 2022-03-17 LAB — CBC
HCT: 36.2 % (ref 36.0–46.0)
Hemoglobin: 11.8 g/dL — ABNORMAL LOW (ref 12.0–15.0)
MCH: 28.4 pg (ref 26.0–34.0)
MCHC: 32.6 g/dL (ref 30.0–36.0)
MCV: 87.2 fL (ref 80.0–100.0)
Platelets: 158 10*3/uL (ref 150–400)
RBC: 4.15 MIL/uL (ref 3.87–5.11)
RDW: 13.1 % (ref 11.5–15.5)
WBC: 8 10*3/uL (ref 4.0–10.5)
nRBC: 0 % (ref 0.0–0.2)

## 2022-03-17 LAB — TYPE AND SCREEN
ABO/RH(D): A POS
Antibody Screen: NEGATIVE

## 2022-03-18 LAB — SYPHILIS: RPR W/REFLEX TO RPR TITER AND TREPONEMAL ANTIBODIES, TRADITIONAL SCREENING AND DIAGNOSIS ALGORITHM: RPR Ser Ql: NONREACTIVE

## 2022-03-19 ENCOUNTER — Other Ambulatory Visit: Payer: Self-pay

## 2022-03-19 ENCOUNTER — Inpatient Hospital Stay (HOSPITAL_COMMUNITY)
Admission: RE | Admit: 2022-03-19 | Discharge: 2022-03-22 | DRG: 788 | Disposition: A | Discharge status: Home / Self Care | Payer: BC Managed Care – PPO | Attending: Obstetrics and Gynecology | Admitting: Obstetrics and Gynecology

## 2022-03-19 ENCOUNTER — Inpatient Hospital Stay (HOSPITAL_COMMUNITY): Payer: BC Managed Care – PPO | Admitting: Anesthesiology

## 2022-03-19 ENCOUNTER — Encounter (HOSPITAL_COMMUNITY)
Admission: RE | Disposition: A | Discharge status: Home / Self Care | Payer: Self-pay | Source: Home / Self Care | Attending: Obstetrics and Gynecology

## 2022-03-19 ENCOUNTER — Encounter (HOSPITAL_COMMUNITY): Payer: Self-pay | Admitting: Obstetrics and Gynecology

## 2022-03-19 DIAGNOSIS — O99824 Streptococcus B carrier state complicating childbirth: Secondary | ICD-10-CM | POA: Diagnosis not present

## 2022-03-19 DIAGNOSIS — O9972 Diseases of the skin and subcutaneous tissue complicating childbirth: Secondary | ICD-10-CM | POA: Diagnosis not present

## 2022-03-19 DIAGNOSIS — O34219 Maternal care for unspecified type scar from previous cesarean delivery: Secondary | ICD-10-CM | POA: Diagnosis not present

## 2022-03-19 DIAGNOSIS — O99820 Streptococcus B carrier state complicating pregnancy: Secondary | ICD-10-CM | POA: Diagnosis not present

## 2022-03-19 DIAGNOSIS — Z98891 History of uterine scar from previous surgery: Secondary | ICD-10-CM

## 2022-03-19 DIAGNOSIS — Z3A39 39 weeks gestation of pregnancy: Secondary | ICD-10-CM

## 2022-03-19 DIAGNOSIS — L910 Hypertrophic scar: Secondary | ICD-10-CM | POA: Diagnosis not present

## 2022-03-19 DIAGNOSIS — Z3A00 Weeks of gestation of pregnancy not specified: Secondary | ICD-10-CM | POA: Diagnosis not present

## 2022-03-19 DIAGNOSIS — O34211 Maternal care for low transverse scar from previous cesarean delivery: Secondary | ICD-10-CM | POA: Diagnosis not present

## 2022-03-19 SURGERY — Surgical Case
Anesthesia: Spinal | Wound class: Clean Contaminated

## 2022-03-19 MED ORDER — SODIUM CHLORIDE 0.9 % IR SOLN
Status: DC | PRN
  Administered 2022-03-19: 1000 mL

## 2022-03-19 MED ORDER — SIMETHICONE 80 MG PO CHEW
80.0000 mg | CHEWABLE_TABLET | Freq: Three times a day (TID) | ORAL | Status: DC
  Administered 2022-03-19 – 2022-03-22 (×8): 80 mg via ORAL
  Filled 2022-03-19 (×8): qty 1

## 2022-03-19 MED ORDER — MORPHINE SULFATE (PF) 0.5 MG/ML IJ SOLN
INTRAMUSCULAR | Status: AC
  Filled 2022-03-19: qty 10

## 2022-03-19 MED ORDER — CEFAZOLIN SODIUM-DEXTROSE 2-4 GM/100ML-% IV SOLN
INTRAVENOUS | Status: AC
  Filled 2022-03-19: qty 100

## 2022-03-19 MED ORDER — PROMETHAZINE HCL 25 MG/ML IJ SOLN: 6.2500 mg | INTRAMUSCULAR | Status: DC | PRN

## 2022-03-19 MED ORDER — DEXAMETHASONE SODIUM PHOSPHATE 4 MG/ML IJ SOLN
INTRAMUSCULAR | Status: AC
  Filled 2022-03-19: qty 1

## 2022-03-19 MED ORDER — KETOROLAC TROMETHAMINE 30 MG/ML IJ SOLN
INTRAMUSCULAR | Status: AC
  Filled 2022-03-19: qty 1

## 2022-03-19 MED ORDER — OXYCODONE HCL 5 MG PO TABS: 5.0000 mg | ORAL_TABLET | Freq: Once | ORAL | Status: DC | PRN

## 2022-03-19 MED ORDER — KETOROLAC TROMETHAMINE 30 MG/ML IJ SOLN
30.0000 mg | Freq: Four times a day (QID) | INTRAMUSCULAR | Status: AC | PRN
  Administered 2022-03-19 – 2022-03-20 (×4): 30 mg via INTRAVENOUS
  Filled 2022-03-19 (×3): qty 1

## 2022-03-19 MED ORDER — IBUPROFEN 600 MG PO TABS
600.0000 mg | ORAL_TABLET | Freq: Four times a day (QID) | ORAL | Status: DC
  Administered 2022-03-20 – 2022-03-22 (×9): 600 mg via ORAL
  Filled 2022-03-19 (×9): qty 1

## 2022-03-19 MED ORDER — OXYCODONE HCL 5 MG PO TABS
5.0000 mg | ORAL_TABLET | ORAL | Status: DC | PRN
  Administered 2022-03-19 – 2022-03-22 (×5): 10 mg via ORAL
  Filled 2022-03-19 (×5): qty 2

## 2022-03-19 MED ORDER — PHENYLEPHRINE HCL (PRESSORS) 10 MG/ML IV SOLN
INTRAVENOUS | Status: DC | PRN
  Administered 2022-03-19: 160 ug via INTRAVENOUS

## 2022-03-19 MED ORDER — SOD CITRATE-CITRIC ACID 500-334 MG/5ML PO SOLN
30.0000 mL | ORAL | Status: AC
  Administered 2022-03-19: 30 mL via ORAL

## 2022-03-19 MED ORDER — POLYETHYLENE GLYCOL 3350 17 G PO PACK
17.0000 g | PACK | Freq: Every day | ORAL | Status: DC
  Administered 2022-03-20 – 2022-03-22 (×3): 17 g via ORAL
  Filled 2022-03-19 (×3): qty 1

## 2022-03-19 MED ORDER — WITCH HAZEL-GLYCERIN EX PADS: 1.0000 | MEDICATED_PAD | CUTANEOUS | Status: DC | PRN

## 2022-03-19 MED ORDER — FENTANYL CITRATE (PF) 100 MCG/2ML IJ SOLN
INTRAMUSCULAR | Status: DC | PRN
  Administered 2022-03-19: 15 ug via INTRATHECAL

## 2022-03-19 MED ORDER — DIPHENHYDRAMINE HCL 25 MG PO CAPS
25.0000 mg | ORAL_CAPSULE | Freq: Four times a day (QID) | ORAL | Status: DC | PRN
  Administered 2022-03-20: 25 mg via ORAL
  Filled 2022-03-19: qty 1

## 2022-03-19 MED ORDER — KETOROLAC TROMETHAMINE 30 MG/ML IJ SOLN: 30.0000 mg | Freq: Four times a day (QID) | INTRAMUSCULAR | Status: AC | PRN

## 2022-03-19 MED ORDER — ENOXAPARIN SODIUM 40 MG/0.4ML IJ SOSY
40.0000 mg | PREFILLED_SYRINGE | INTRAMUSCULAR | Status: DC
  Administered 2022-03-20 – 2022-03-22 (×3): 40 mg via SUBCUTANEOUS
  Filled 2022-03-19 (×3): qty 0.4

## 2022-03-19 MED ORDER — OXYTOCIN-SODIUM CHLORIDE 30-0.9 UT/500ML-% IV SOLN
INTRAVENOUS | Status: DC | PRN
  Administered 2022-03-19: 200 mL via INTRAVENOUS

## 2022-03-19 MED ORDER — COCONUT OIL OIL
1.0000 | TOPICAL_OIL | Status: DC | PRN
Start: 2022-03-19 — End: 2022-03-22

## 2022-03-19 MED ORDER — GABAPENTIN 100 MG PO CAPS
100.0000 mg | ORAL_CAPSULE | Freq: Two times a day (BID) | ORAL | Status: DC
  Administered 2022-03-19 – 2022-03-22 (×6): 100 mg via ORAL
  Filled 2022-03-19 (×8): qty 1

## 2022-03-19 MED ORDER — ACETAMINOPHEN 10 MG/ML IV SOLN: 1000.0000 mg | Freq: Once | INTRAVENOUS | Status: DC | PRN

## 2022-03-19 MED ORDER — ONDANSETRON HCL 4 MG/2ML IJ SOLN
INTRAMUSCULAR | Status: DC | PRN
  Administered 2022-03-19: 4 mg via INTRAVENOUS

## 2022-03-19 MED ORDER — FENTANYL CITRATE (PF) 100 MCG/2ML IJ SOLN
INTRAMUSCULAR | Status: AC
  Filled 2022-03-19: qty 2

## 2022-03-19 MED ORDER — PRENATAL MULTIVITAMIN CH
1.0000 | ORAL_TABLET | Freq: Every day | ORAL | Status: DC
  Administered 2022-03-20 – 2022-03-21 (×2): 1 via ORAL
  Filled 2022-03-19 (×2): qty 1

## 2022-03-19 MED ORDER — TETANUS-DIPHTH-ACELL PERTUSSIS 5-2.5-18.5 LF-MCG/0.5 IM SUSY: 0.5000 mL | PREFILLED_SYRINGE | Freq: Once | INTRAMUSCULAR | Status: DC

## 2022-03-19 MED ORDER — STERILE WATER FOR IRRIGATION IR SOLN
Status: DC | PRN
  Administered 2022-03-19: 1000 mL

## 2022-03-19 MED ORDER — OXYCODONE HCL 5 MG/5ML PO SOLN: 5.0000 mg | Freq: Once | ORAL | Status: DC | PRN

## 2022-03-19 MED ORDER — SOD CITRATE-CITRIC ACID 500-334 MG/5ML PO SOLN
ORAL | Status: AC
  Filled 2022-03-19: qty 30

## 2022-03-19 MED ORDER — SENNOSIDES-DOCUSATE SODIUM 8.6-50 MG PO TABS: 2.0000 | ORAL_TABLET | Freq: Every evening | ORAL | Status: DC | PRN

## 2022-03-19 MED ORDER — ACETAMINOPHEN 10 MG/ML IV SOLN
INTRAVENOUS | Status: AC
  Filled 2022-03-19: qty 100

## 2022-03-19 MED ORDER — MORPHINE SULFATE (PF) 0.5 MG/ML IJ SOLN
INTRAMUSCULAR | Status: DC | PRN
  Administered 2022-03-19: 150 ug via INTRATHECAL

## 2022-03-19 MED ORDER — MEPERIDINE HCL 25 MG/ML IJ SOLN: 6.2500 mg | INTRAMUSCULAR | Status: DC | PRN

## 2022-03-19 MED ORDER — PHENYLEPHRINE HCL-NACL 20-0.9 MG/250ML-% IV SOLN
INTRAVENOUS | Status: DC | PRN
  Administered 2022-03-19: 60 ug/min via INTRAVENOUS

## 2022-03-19 MED ORDER — OXYTOCIN-SODIUM CHLORIDE 30-0.9 UT/500ML-% IV SOLN
2.5000 [IU]/h | INTRAVENOUS | Status: AC
  Administered 2022-03-19: 2.5 [IU]/h via INTRAVENOUS
  Filled 2022-03-19: qty 500

## 2022-03-19 MED ORDER — DIBUCAINE (PERIANAL) 1 % EX OINT: 1.0000 | TOPICAL_OINTMENT | CUTANEOUS | Status: DC | PRN

## 2022-03-19 MED ORDER — ONDANSETRON HCL 4 MG/2ML IJ SOLN
INTRAMUSCULAR | Status: AC
  Filled 2022-03-19: qty 2

## 2022-03-19 MED ORDER — DEXAMETHASONE SODIUM PHOSPHATE 4 MG/ML IJ SOLN
INTRAMUSCULAR | Status: DC | PRN
  Administered 2022-03-19: 4 mg via INTRAVENOUS

## 2022-03-19 MED ORDER — OXYTOCIN-SODIUM CHLORIDE 30-0.9 UT/500ML-% IV SOLN
INTRAVENOUS | Status: AC
  Filled 2022-03-19: qty 500

## 2022-03-19 MED ORDER — MENTHOL 3 MG MT LOZG: 1.0000 | LOZENGE | OROMUCOSAL | Status: DC | PRN

## 2022-03-19 MED ORDER — CEFAZOLIN SODIUM-DEXTROSE 2-4 GM/100ML-% IV SOLN
2.0000 g | INTRAVENOUS | Status: AC
  Administered 2022-03-19: 2 g via INTRAVENOUS

## 2022-03-19 MED ORDER — BUPIVACAINE IN DEXTROSE 0.75-8.25 % IT SOLN
INTRATHECAL | Status: DC | PRN
  Administered 2022-03-19: 2 mL via INTRATHECAL

## 2022-03-19 MED ORDER — HYDROMORPHONE HCL 1 MG/ML IJ SOLN
INTRAMUSCULAR | Status: AC
  Filled 2022-03-19: qty 0.5

## 2022-03-19 MED ORDER — ACETAMINOPHEN 10 MG/ML IV SOLN
INTRAVENOUS | Status: DC | PRN
  Administered 2022-03-19: 1000 mg via INTRAVENOUS

## 2022-03-19 MED ORDER — LACTATED RINGERS IV SOLN: INTRAVENOUS | Status: DC

## 2022-03-19 MED ORDER — PHENYLEPHRINE HCL-NACL 20-0.9 MG/250ML-% IV SOLN
INTRAVENOUS | Status: AC
  Filled 2022-03-19: qty 250

## 2022-03-19 MED ORDER — HYDROMORPHONE HCL 1 MG/ML IJ SOLN
0.2500 mg | INTRAMUSCULAR | Status: DC | PRN
  Administered 2022-03-19 (×2): 0.25 mg via INTRAVENOUS

## 2022-03-19 SURGICAL SUPPLY — 35 items
BENZOIN TINCTURE PRP APPL 2/3 (GAUZE/BANDAGES/DRESSINGS) ×1 IMPLANT
CANISTER SUCT 3000ML PPV (MISCELLANEOUS) ×1 IMPLANT
CHLORAPREP W/TINT 26ML (MISCELLANEOUS) ×2 IMPLANT
CLAMP UMBILICAL CORD (MISCELLANEOUS) ×1 IMPLANT
DRSG OPSITE POSTOP 4X10 (GAUZE/BANDAGES/DRESSINGS) ×1 IMPLANT
ELECT REM PT RETURN 9FT ADLT (ELECTROSURGICAL) ×1
ELECTRODE REM PT RTRN 9FT ADLT (ELECTROSURGICAL) ×1 IMPLANT
EXTRACTOR VACUUM KIWI (MISCELLANEOUS) ×1 IMPLANT
GAUZE SPONGE 4X4 12PLY STRL LF (GAUZE/BANDAGES/DRESSINGS) IMPLANT
GLOVE BIOGEL PI IND STRL 7.0 (GLOVE) ×2 IMPLANT
GLOVE BIOGEL PI IND STRL 7.5 (GLOVE) ×1 IMPLANT
GLOVE BIOGEL PI INDICATOR 7.0 (GLOVE) ×2
GLOVE BIOGEL PI INDICATOR 7.5 (GLOVE) ×1
GLOVE SKINSENSE NS SZ7.0 (GLOVE) ×1
GLOVE SKINSENSE STRL SZ7.0 (GLOVE) ×1 IMPLANT
GOWN STRL REUS W/ TWL LRG LVL3 (GOWN DISPOSABLE) ×2 IMPLANT
GOWN STRL REUS W/ TWL XL LVL3 (GOWN DISPOSABLE) ×1 IMPLANT
GOWN STRL REUS W/TWL LRG LVL3 (GOWN DISPOSABLE) ×2
GOWN STRL REUS W/TWL XL LVL3 (GOWN DISPOSABLE) ×1
NS IRRIG 1000ML POUR BTL (IV SOLUTION) ×1 IMPLANT
PACK C SECTION WH (CUSTOM PROCEDURE TRAY) ×1 IMPLANT
PAD ABD 7.5X8 STRL (GAUZE/BANDAGES/DRESSINGS) ×1 IMPLANT
PAD OB MATERNITY 4.3X12.25 (PERSONAL CARE ITEMS) ×1 IMPLANT
PAD PREP 24X48 CUFFED NSTRL (MISCELLANEOUS) ×1 IMPLANT
STAPLER VISISTAT 35W (STAPLE) IMPLANT
STRIP CLOSURE SKIN 1/2X4 (GAUZE/BANDAGES/DRESSINGS) ×1 IMPLANT
SUT MNCRL 0 VIOLET CTX 36 (SUTURE) ×2 IMPLANT
SUT MON AB 4-0 PS1 27 (SUTURE) ×1 IMPLANT
SUT MONOCRYL 0 CTX 36 (SUTURE) ×2
SUT PLAIN 2 0 XLH (SUTURE) ×1 IMPLANT
SUT VIC AB 0 CT1 36 (SUTURE) ×2 IMPLANT
SUT VIC AB 3-0 CT1 27 (SUTURE) ×1
SUT VIC AB 3-0 CT1 TAPERPNT 27 (SUTURE) ×1 IMPLANT
TOWEL OR 17X24 6PK STRL BLUE (TOWEL DISPOSABLE) ×2 IMPLANT
WATER STERILE IRR 1000ML POUR (IV SOLUTION) ×1 IMPLANT

## 2022-03-19 NOTE — Op Note (Addendum)
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   SURGEON: Surgeon(s) and Role:    * Watch Hill Bing, MD - Primary  ASSISTANT:    Myna Hidalgo, DO - Assisting  An experienced assistant was required given the standard of surgical care given the complexity of the case.  This assistant was needed for exposure, dissection, suctioning, retraction, instrument exchange,  assisting with delivery with administration of fundal pressure, and for overall help during the procedure.   ANESTHESIA: spinal  ESTIMATED BLOOD LOSS: 300 mL  DRAINS: 9mL UOP via indwelling foley  TOTAL IV FLUIDS: per anesthesia note  VTE PROPHYLAXIS: SCDs to bilateral lower extremities  ANTIBIOTICS: Two grams of Cefazolin were given, within 1 hour of skin incision  SPECIMENS: none  COMPLICATIONS: none  FINDINGS: Keloid at the old transverse skin incision. Filmy adhesions were noted in the area of the bladder flap. Grossly normal uterus and adnexa. Clear amniotic fluid, cephalic,  female infant, weight 3890gm, APGARs 9/9, intact placenta.  PROCEDURE IN DETAIL: The patient was taken to the operating room where anesthesia was administered and normal fetal heart tones were confirmed. She was then prepped and draped in the normal fashion in the dorsal supine position with a leftward tilt.  After a time out was performed, the old keloid was excised and a pfannensteil  skin incision was made with the scalpel and carried through to the underlying layer of fascia. The fascia was then incised at the midline and this incision was extended laterally with the mayo scissors. Attention was turned to the superior aspect of the fascial incision which  was grasped with the kocher clamps x 2, tented up and the rectus muscles were dissected off with the scalpel. In a similar fashion the inferior aspect of the fascial incision was grasped with the kocher clamps, tented up and the rectus muscles dissected off with the mayo scissors. The rectus muscles were then separated in the midline and the peritoneum was entered bluntly. The bladder blade was inserted and the vesicouterine peritoneum was identified, tented up and entered with the metzenbaum scissors. This incision was extended laterally and the bladder flap was created digitally. The bladder blade was reinserted.  A low transverse hysterotomy was made with the scalpel until the endometrial cavity was breached and the amniotic sac ruptured, yielding clear amniotic fluid. This incision was extended bluntly and the infant's head, shoulders and body were delivered atraumatically.The cord was clamped x 2 and cut, and the infant was handed to the awaiting pediatricians, after delayed cord clamping was done.  The placenta was then gradually expressed from the uterus and then the uterus was exteriorized and cleared of all clots and debris. The hysterotomy was repaired with a running suture of 1-0 monocryl. A second imbricating layer of 1-0 monocryl suture was then placed to achieve excellent hemostasis.   The uterus and adnexa were then returned to the abdomen, and the hysterotomy and all operative sites were reinspected and excellent hemostasis was noted after irrigation and suction of the abdomen with warm saline.  The peritoneum was closed with a running stitch of 3-0 Vicryl. The fascia was reapproximated with 0 Vicryl in a simple running fashion . The subcutaneous layer was then reapproximated with interrupted  sutures of 2-0 plain gut. The skin was previously closed with 4-0 vicryl for the 1st and 4-0 monocryl for the 2nd c-section so I discussed with the patient and she was amenable to staples, which were  placed  The patient  tolerated the procedure well. Sponge, lap, needle, and instrument counts were correct x 2. The patient was transferred to the recovery room awake, alert and breathing independently in stable condition.  Plan is to remove staples pod#3 and then apply benzoin and steri strips  Cornelia Copa MD Attending Center for [facility])

## 2022-03-19 NOTE — Anesthesia Postprocedure Evaluation (Signed)
Anesthesia Post Note  Patient: Wendy Aguilar  Procedure(s) Performed: CESAREAN SECTION     Patient location during evaluation: PACU Anesthesia Type: Spinal Level of consciousness: oriented and awake and alert Pain management: pain level controlled Vital Signs Assessment: post-procedure vital signs reviewed and stable Respiratory status: spontaneous breathing, respiratory function stable and nonlabored ventilation Cardiovascular status: blood pressure returned to baseline and stable Postop Assessment: no headache, no backache, no apparent nausea or vomiting and spinal receding Anesthetic complications: no   No notable events documented.  Last Vitals:  Vitals:   03/19/22 1222 03/19/22 1330  BP: (!) 109/53 (!) 109/58  Pulse: (!) 56   Resp: 18 16  Temp: (!) 36.4 C   SpO2: 100% 100%    Last Pain:  Vitals:   03/19/22 1330  TempSrc:   PainSc: 0-No pain   Pain Goal:                   Lucretia Kern

## 2022-03-19 NOTE — Transfer of Care (Signed)
Immediate Anesthesia Transfer of Care Note  Patient: Wendy Aguilar  Procedure(s) Performed: CESAREAN SECTION  Patient Location: PACU  Anesthesia Type:Spinal  Level of Consciousness: awake, alert  and oriented  Airway & Oxygen Therapy: Patient Spontanous Breathing  Post-op Assessment: Report given to RN and Post -op Vital signs reviewed and stable  Post vital signs: Reviewed and stable  Last Vitals:  Vitals Value Taken Time  BP 106/60 03/19/22 1035  Temp    Pulse 73 03/19/22 1038  Resp 15 03/19/22 1038  SpO2 99 % 03/19/22 1038  Vitals shown include unvalidated device data.  Last Pain:  Vitals:   03/19/22 0817  TempSrc: Oral         Complications: No notable events documented.

## 2022-03-19 NOTE — Lactation Note (Addendum)
This note was copied from a baby's chart. Lactation Consultation Note  Patient Name: Wendy Aguilar Date: 03/19/2022 Reason for consult: Initial assessment;Term Age:23 hours P3, term female infant. Infant had two stool diapers since birth. Per Birth Parent, infant is  only latched  infant on the left breast, infant sustains latch and BF for 15-20 minutes, but infant will not latch on the right breast and would like latch assistance. Birth Parent latched infant on her right breast initially using football hold but switched to cross cradle hold, infant was on and off the breast during the feeding for 9 minutes.  Birth Parent will continue to work on latching infant on the right breast and will ask RN/LC for continue latch assistance if needed. Birth Parent given a  hand pump to pre-pump breast to help evert nipple shaft out  and elongate nipple see nipple type below. Birth Parent fitted with 27 mm breast flange. Birth Parent will continue to BF according to hunger cues, on demand, 8 to 12+ times within 24 hours, STS. Birth Parent is  aware of O/P services, breastfeeding support groups, community resources, and our phone # for post-discharge questions.   Birth Parent plans to call insurance in morning to see if eligible for DEBP, if not try Stork Pump, Birth Parent has two insurances.  Maternal Data Has patient been taught Hand Expression?: Yes Does the patient have breastfeeding experience prior to this delivery?: Yes How long did the patient breastfeed?: Per Birth Parent , 1st and 2nd child only latched in hospital.  Feeding Mother's Current Feeding Choice: Breast Milk  LATCH Score Latch: Repeated attempts needed to sustain latch, nipple held in mouth throughout feeding, stimulation needed to elicit sucking reflex.  Audible Swallowing: A few with stimulation  Type of Nipple: Inverted  Comfort (Breast/Nipple): Soft / non-tender  Hold (Positioning): Assistance needed to  correctly position infant at breast and maintain latch.  LATCH Score: 5   Lactation Tools Discussed/Used Tools: Pump Breast pump type: Manual Pump Education: Setup, frequency, and cleaning;Milk Storage Reason for Pumping: Pre-pump prior to latching infant Birth Parent has inverted nipples. Pumping frequency: Pre-pump prior to latching infant at the breast.  Interventions Interventions: Breast feeding basics reviewed;Assisted with latch;Skin to skin;Pre-pump if needed;Hand express;Breast compression;Adjust position;Support pillows;Position options;Hand pump;Education;LC Services brochure  Discharge    Consult Status Consult Status: Follow-up Date: 03/20/22 Follow-up type: In-patient    Danelle Earthly 03/19/2022, 9:16 PM

## 2022-03-19 NOTE — H&P (Signed)
Obstetrics Admission History & Physical  03/19/2022 - 8:53 AM Primary OBGYN: [facility]  Chief Complaint: scheduled repeat c-section  History of Present Illness  21 y.o. W2O3785 @ [redacted]w[redacted]d, with the above CC. Pregnancy complicated by: h/o c-section x 2, BMI 30, GBS positive  Ms. [place] A Wiles states that she is doing well and no OB s/s or decreased FM  Review of Systems: as noted in the History of Present Illness.  PMHx:  Past Medical History:  Diagnosis Date   Medical history non-contributory    PSHx:  Past Surgical History:  Procedure Laterality Date   CESAREAN SECTION N/A 10/10/2016   Procedure: CESAREAN SECTION;  Surgeon: Adam Phenix, MD;  Location: [facility] BIRTHING SUITES;  Service: Obstetrics;  Laterality: N/A;   CESAREAN SECTION N/A 03/19/2018   Procedure: CESAREAN SECTION;  Surgeon: Conan Bowens, MD;  Location: [facility] BIRTHING SUITES;  Service: Obstetrics;  Laterality: N/A;   Medications:  Medications Prior to Admission  Medication Sig Dispense Refill Last Dose   prenatal vitamin w/FE, FA (PRENATAL 1 + 1) 27-1 MG TABS tablet Take 1 tablet by mouth daily at 12 noon. 30 tablet 11      Allergies: has No Known Allergies. OBHx:  OB History  Gravida Para Term Preterm AB Living  3 2 2  0 0 2  SAB IAB Ectopic Multiple Live Births  0 0 0 0 2    # Outcome Date GA Lbr Len/2nd Weight Sex Delivery Anes PTL Lv  3 Current           2 Term 03/20/18 [redacted]w[redacted]d  3340 g M CS-LTranv Gen  LIV  1 Term 10/10/16 [redacted]w[redacted]d  3325 g F CS-Vac Gen  LIV        FHx:  Family History  Problem Relation Age of Onset   Hypertension Mother    Alcohol abuse Neg Hx    Arthritis Neg Hx    Asthma Neg Hx    Birth defects Neg Hx    COPD Neg Hx    Depression Neg Hx    Diabetes Neg Hx    Drug abuse Neg Hx    Early death Neg Hx    Hearing loss Neg Hx    Heart disease Neg Hx    Hyperlipidemia Neg Hx    Kidney disease Neg Hx    Learning disabilities Neg Hx    Mental  illness Neg Hx    Mental retardation Neg Hx    Miscarriages / Stillbirths Neg Hx    Stroke Neg Hx    Vision loss Neg Hx    Varicose Veins Neg Hx    Soc Hx:  Social History   Socioeconomic History   Marital status: Single    Spouse name: Not on file   Number of children: Not on file   Years of education: Not on file   Highest education level: Not on file  Occupational History   Not on file  Tobacco Use   Smoking status: Never   Smokeless tobacco: Never  Vaping Use   Vaping Use: Never used  Substance and Sexual Activity   Alcohol use: Not Currently    Comment: not while preg   Drug use: No   Sexual activity: Yes    Birth control/protection: None  Other Topics Concern   Not on file  Social History Narrative   Not on file   Social Determinants of Health   Financial Resource Strain: Low Risk  (05/22/2019)  Overall Financial Resource Strain (CARDIA)    Difficulty of Paying Living Expenses: Not hard at all  Food Insecurity: No Food Insecurity (03/10/2022)   Hunger Vital Sign    Worried About Running Out of Food in the Last Year: Never true    Ran Out of Food in the Last Year: Never true  Transportation Needs: No Transportation Needs (03/10/2022)   PRAPARE - Administrator, Civil Service (Medical): No    Lack of Transportation (Non-Medical): No  Physical Activity: Insufficiently Active (05/22/2019)   Exercise Vital Sign    Days of Exercise per Week: 7 days    Minutes of Exercise per Session: 20 min  Stress: No Stress Concern Present (05/22/2019)   [employer] of Occupational Health - Occupational Stress Questionnaire    Feeling of Stress : Not at all  Social Connections: Somewhat Isolated (05/22/2019)   Social Connection and Isolation Panel [NHANES]    Frequency of Communication with Friends and Family: More than three times a week    Frequency of Social Gatherings with Friends and Family: Three times a week    Attends Religious Services: More than 4  times per year    Active Member of Clubs or Organizations: No    Attends Banker Meetings: Never    Marital Status: Never married  Intimate Partner Violence: Not At Risk (05/22/2019)   Humiliation, Afraid, Rape, and Kick questionnaire    Fear of Current or Ex-Partner: No    Emotionally Abused: No    Physically Abused: No    Sexually Abused: No    Objective    Current Vital Signs 24h Vital Sign Ranges  T 98.7 F (37.1 C) Temp  Avg: 98.7 F (37.1 C)  Min: 98.7 F (37.1 C)  Max: 98.7 F (37.1 C)  BP 119/72 BP  Min: 119/72  Max: 119/72  HR 65 Pulse  Avg: 65  Min: 65  Max: 65  RR 18 Resp  Avg: 18  Min: 18  Max: 18  SaO2 99 % Room Air SpO2  Avg: 99 %  Min: 99 %  Max: 99 %       24 Hour I/O Current Shift I/O  Time Ins Outs No intake/output data recorded. No intake/output data recorded.   Fetal Dopplers: 140s   General: Well nourished, well developed female in no acute distress.  Skin:  Warm and dry.  Cardiovascular: S1, S2 normal, no murmur, rub or gallop, regular rate and rhythm Respiratory:  Clear to auscultation bilateral. Normal respiratory effort Abdomen: gravid, nttp Neuro/Psych:  Normal mood and affect.   Labs  Recent Labs  Lab 03/17/22 0937  WBC 8.0  HGB 11.8*  HCT 36.2  PLT 158  A POS Rprp neg  Radiology No new imaging Posterior placenta  Assessment & Plan   21 y.o. H6P5916 @ [redacted]w[redacted]d here for scheduled c-section. Pt doing well *Pregnancy: mother/baby doing well *h/o c-section x 2: for rpt this morning. Desires ocps or nuvaring  Can proceed when OR is ready  Cornelia Copa MD Attending Center for [facility])

## 2022-03-19 NOTE — Anesthesia Preprocedure Evaluation (Addendum)
Anesthesia Evaluation  Patient identified by MRN, date of birth, ID band Patient awake    Reviewed: Allergy & Precautions, H&P , NPO status , Patient's Chart, lab work & pertinent test results  History of Anesthesia Complications Negative for: history of anesthetic complications  Airway Mallampati: II  TM Distance: >3 FB     Dental   Pulmonary neg pulmonary ROS,    Pulmonary exam normal        Cardiovascular negative cardio ROS   Rhythm:regular Rate:Normal     Neuro/Psych negative neurological ROS  negative psych ROS   GI/Hepatic negative GI ROS, Neg liver ROS,   Endo/Other  negative endocrine ROS  Renal/GU negative Renal ROS  negative genitourinary   Musculoskeletal   Abdominal   Peds  Hematology negative hematology ROS (+)   Anesthesia Other Findings   Reproductive/Obstetrics (+) Pregnancy G3P2002 at [redacted]w[redacted]d Prior C/S x2                            Anesthesia Physical Anesthesia Plan  ASA: 2  Anesthesia Plan: Spinal   Post-op Pain Management:    Induction:   PONV Risk Score and Plan: Ondansetron and Treatment may vary due to age or medical condition  Airway Management Planned:   Additional Equipment:   Intra-op Plan:   Post-operative Plan:   Informed Consent: I have reviewed the patients History and Physical, chart, labs and discussed the procedure including the risks, benefits and alternatives for the proposed anesthesia with the patient or authorized representative who has indicated his/her understanding and acceptance.       Plan Discussed with: Anesthesiologist  Anesthesia Plan Comments: (Patient required general anesthesia for both prior C/S due to inadequate block with epidural. She is amenable to trying a spinal today.)       Anesthesia Quick Evaluation

## 2022-03-19 NOTE — Discharge Summary (Shared)
Postpartum Discharge Summary  Date of Service updated***     Patient Name: Wendy Aguilar DOB: [birth year] MRN: [id number]  Date of admission: 03/19/2022 Delivery date:03/19/2022  Delivering provider: Aletha Halim  Date of discharge: 03/19/2022  Admitting diagnosis: Pregnancy at 39/6. History of c-section x 2 and desire for repeat.  Additional problems: None    Discharge diagnosis: {DX.:23714}                                              Post partum procedures:{Postpartum procedures:23558} Augmentation: N/A Complications: {OB Labor/Delivery Complications:20784}  Hospital course: Sceduled C/S   21 y.o. yo G3P3003 at 10w6dwas admitted to the hospital 03/19/2022 for scheduled cesarean section with the following indication:Elective Repeat.Delivery details are as follows:  Membrane Rupture Time/Date: 9:57 AM ,03/19/2022   Delivery Method:C-Section, Low Transverse  Details of operation can be found in separate operative note.  Patient had an uncomplicated postpartum course.  She is ambulating, tolerating a regular diet, passing flatus, and urinating well. Patient is discharged home in stable condition on  03/19/22        Newborn Data: Birth date:03/19/2022  Birth time:9:57 AM  Gender:Female  Living status:Living  Apgars:9 ,9  Weight:3890 g     Magnesium Sulfate received: {Mag received:30440022} BMZ received: {BMZ received:30440023} Rhophylac:{Rhophylac received:30440032} MGQB:{VQX:45038882}T-DaP:{Tdap:23962} Flu: {{CMK:34917}Transfusion:{Transfusion received:30440034}  Physical exam  Vitals:   03/19/22 1130 03/19/22 1145 03/19/22 1200 03/19/22 1222  BP: 109/69 (!) 104/58 95/64 (!) 109/53  Pulse: (!) 55 (!) 51 60 (!) 56  Resp: '16 17 14 18  ' Temp:    (!) 97.5 F (36.4 C)  TempSrc:    Axillary  SpO2: 100% 100% 100% 100%  Weight:      Height:       General: {Exam; general:21111117} Lochia: {Desc; appropriate/inappropriate:30686::"appropriate"} Uterine Fundus: {Desc;  firm/soft:30687} Incision: {Exam; incision:21111123} DVT Evaluation: {Exam; dvt:2111122} Labs: Lab Results  Component Value Date   WBC 8.0 03/17/2022   HGB 11.8 (L) 03/17/2022   HCT 36.2 03/17/2022   MCV 87.2 03/17/2022   PLT 158 03/17/2022      Latest Ref Rng & Units 11/28/2021   12:55 PM  CMP  Glucose 70 - 99 mg/dL 79   BUN 6 - 20 mg/dL 7   Creatinine 0.44 - 1.00 mg/dL 0.58   Sodium 135 - 145 mmol/L 135   Potassium 3.5 - 5.1 mmol/L 3.5   Chloride 98 - 111 mmol/L 109   CO2 22 - 32 mmol/L 23   Calcium 8.9 - 10.3 mg/dL 8.4   Total Protein 6.5 - 8.1 g/dL 6.9   Total Bilirubin 0.3 - 1.2 mg/dL 0.6   Alkaline Phos 38 - 126 U/L 47   AST 15 - 41 U/L 15   ALT 0 - 44 U/L 12    Edinburgh Score:    03/19/2022   12:22 PM  Edinburgh Postnatal Depression Scale Screening Tool  I have been able to laugh and see the funny side of things. 0  I have looked forward with enjoyment to things. 0  I have blamed myself unnecessarily when things went wrong. 0  I have been anxious or worried for no good reason. 2  I have felt scared or panicky for no good reason. 2  Things have been getting on top of me. 1  I have been so unhappy that I  have had difficulty sleeping. 0  I have felt sad or miserable. 1  I have been so unhappy that I have been crying. 0  The thought of harming myself has occurred to me. 0  Edinburgh Postnatal Depression Scale Total 6      After visit meds:  Allergies as of 03/19/2022   No Known Allergies   Med Rec must be completed prior to using this [facility]***        Discharge home in stable condition Infant Feeding: {Baby feeding:23562} Infant Disposition:{CHL IP OB HOME WITH XNTZGY:17494} Discharge instruction: per After Visit Summary and Postpartum booklet. Activity: Advance as tolerated. Pelvic rest for 6 weeks.  Diet: {OB diet:21111121} Anticipated Birth Control: {Birth Control:23956} Postpartum Appointment:{Outpatient follow up:23559} Additional  Postpartum F/U: {PP Procedure:23957} Future Appointments: Future Appointments  Date Time Provider [place]  03/24/2022  1:15 PM Clarnce Flock, MD [facility]   Follow up Visit:      03/19/2022 Aletha Halim, MD

## 2022-03-19 NOTE — Anesthesia Procedure Notes (Signed)
Spinal  Patient location during procedure: OR Reason for block: surgical anesthesia Staffing Performed: anesthesiologist  Anesthesiologist: Reymundo Winship E, MD Performed by: Teodora Baumgarten E, MD Authorized by: Deven Furia E, MD   Preanesthetic Checklist Completed: patient identified, IV checked, risks and benefits discussed, surgical consent, monitors and equipment checked, pre-op evaluation and timeout performed Spinal Block Patient position: sitting Prep: DuraPrep and site prepped and draped Patient monitoring: continuous pulse ox, blood pressure and heart rate Approach: midline Location: L3-4 Injection technique: single-shot Needle Needle type: Pencan  Needle gauge: 24 G Needle length: 10 cm Assessment Events: CSF return Additional Notes Functioning IV was confirmed and monitors were applied. Sterile prep and drape, including hand hygiene and sterile gloves were used. The patient was positioned and the spine was prepped. The skin was anesthetized with lidocaine.  Free flow of clear CSF was obtained prior to injecting local anesthetic into the CSF. The needle was carefully withdrawn. The patient tolerated the procedure well.      

## 2022-03-20 LAB — CBC
HCT: 30.5 % — ABNORMAL LOW (ref 36.0–46.0)
Hemoglobin: 10 g/dL — ABNORMAL LOW (ref 12.0–15.0)
MCH: 28.7 pg (ref 26.0–34.0)
MCHC: 32.8 g/dL (ref 30.0–36.0)
MCV: 87.4 fL (ref 80.0–100.0)
Platelets: 125 10*3/uL — ABNORMAL LOW (ref 150–400)
RBC: 3.49 MIL/uL — ABNORMAL LOW (ref 3.87–5.11)
RDW: 13.2 % (ref 11.5–15.5)
WBC: 9.3 10*3/uL (ref 4.0–10.5)
nRBC: 0 % (ref 0.0–0.2)

## 2022-03-20 LAB — CREATININE, SERUM
Creatinine, Ser: 0.58 mg/dL (ref 0.44–1.00)
GFR, Estimated: >60 mL/min (ref >60)

## 2022-03-20 MED ORDER — ACETAMINOPHEN 500 MG PO TABS
1000.0000 mg | ORAL_TABLET | Freq: Three times a day (TID) | ORAL | Status: DC
  Administered 2022-03-20 – 2022-03-22 (×5): 1000 mg via ORAL
  Filled 2022-03-20 (×5): qty 2

## 2022-03-20 MED ORDER — FERROUS GLUCONATE 324 (38 FE) MG PO TABS
324.0000 mg | ORAL_TABLET | Freq: Every day | ORAL | Status: DC
  Administered 2022-03-20 – 2022-03-21 (×2): 324 mg via ORAL
  Filled 2022-03-20 (×4): qty 1

## 2022-03-20 NOTE — Lactation Note (Signed)
This note was copied from a baby's chart. Lactation Consultation Note  Patient Name: Girl [place] Cederberg CYELY'H Date: 03/20/2022 Reason for consult: Follow-up assessment;Term;Infant weight loss (5 % weight loss, per Birth parent breastfeeding is going well and updated the LC. LC reviewed the doc flow sheets with the Birth parent. LC encouraged the Birth parent to call is needing latch assistance.) Age:23 hours Birth parent decided she desired the [facility] . LC sent the referral and spoke with the rep and it would be delivered today.  Maternal Data Has patient been taught Hand Expression?: Yes  Feeding Mother's Current Feeding Choice: Breast Milk  LATCH Score ( Latch score by the [facility] )  Latch: Grasps breast easily, tongue down, lips flanged, rhythmical sucking.  Audible Swallowing: A few with stimulation  Type of Nipple: Everted at rest and after stimulation  Comfort (Breast/Nipple): Soft / non-tender  Hold (Positioning): Assistance needed to correctly position infant at breast and maintain latch.  LATCH Score: 8   Lactation Tools Discussed/Used Breast pump type: Manual (previous LC provided the Hand pump)  Interventions Interventions: Breast feeding basics reviewed;Education  Discharge Pump: Stork Pump  Consult Status Consult Status: Follow-up Date: 03/21/22 Follow-up type: In-patient    Matilde Sprang Cristopher Ciccarelli 03/20/2022, 11:07 AM

## 2022-03-20 NOTE — Progress Notes (Signed)
POSTPARTUM PROGRESS NOTE  POD #1  Subjective:  Wendy Aguilar is Wendy 21 y.o. G3P3003 s/p rLTCS at [redacted]w[redacted]d. Today she notes some nausea/vomiting yesterday.  She has been able to tolerate some po water and light diet. She denies any problems with ambulating or voiding. She has not passed flatus, no BM.  Pain is well controlled.  Lochia minimal Denies fever/chills/chest pain/SOB.    Objective: Blood pressure 112/62, pulse 70, temperature 97.8 F (36.6 C), temperature source Oral, resp. rate 16, height 5\' 9"  (1.753 m), weight 93.4 kg, last menstrual period 05/31/2021, SpO2 100 %, unknown if currently breastfeeding.  Physical Exam:  General: alert, cooperative and no distress Chest: no respiratory distress, CTAB Heart: regular rate and rhythm Abdomen: soft, nontender, BS quiet Uterine Fundus: firm, appropriately tender Incision: clean and dry with pressure dressing DVT Evaluation: No calf swelling or tenderness Extremities: no edema Skin: warm, dry  Results for orders placed or performed during the hospital encounter of 03/19/22 (from the past 24 hour(s))  CBC     Status: Abnormal   Collection Time: 03/20/22  4:35 AM  Result Value Ref Range   WBC 9.3 4.0 - 10.5 K/uL   RBC 3.49 (L) 3.87 - 5.11 MIL/uL   Hemoglobin 10.0 (L) 12.0 - 15.0 g/dL   HCT 03/22/22 (L) 18.5 - 63.1 %   MCV 87.4 80.0 - 100.0 fL   MCH 28.7 26.0 - 34.0 pg   MCHC 32.8 30.0 - 36.0 g/dL   RDW 49.7 02.6 - 37.8 %   Platelets 125 (L) 150 - 400 K/uL   nRBC 0.0 0.0 - 0.2 %  Creatinine, serum     Status: None   Collection Time: 03/20/22  4:35 AM  Result Value Ref Range   Creatinine, Ser 0.58 0.44 - 1.00 mg/dL   GFR, Estimated 03/22/22 >50 mL/min    Assessment/Plan: >27 Wendy Aguilar is Wendy 21 y.o. G3P3003 s/p rLTCS at [redacted]w[redacted]d POD#1  -pain well controlled -advance diet as tolerated -plan to remove pressure dressing and staples, on POD#3 -CBC with appropriate decline, VSS, iron daily -continue routine postop  care  Contraception: OCPs v Ring- not sure  Dispo: Continue postop care as outlined above   LOS: 1 day   [redacted]w[redacted]d, DO Faculty Attending, Center for [facility] 03/20/2022, 7:51 AM

## 2022-03-21 NOTE — Progress Notes (Signed)
Subjective: Postpartum Day 2: Cesarean Delivery Patient reports incisional pain, tolerating PO, and no problems voiding.    Objective: Vital signs in last 24 hours: Temp:  [97.7 F (36.5 C)-98.1 F (36.7 C)] 98.1 F (36.7 C) (08/20 0514) Pulse Rate:  [54-58] 54 (08/20 0514) Resp:  [16-18] 16 (08/20 0514) BP: (101-108)/(49-57) 107/57 (08/20 0514) SpO2:  [100 %] 100 % (08/20 0514)  Physical Exam:  General: alert, cooperative, and appears stated age Lochia: appropriate Uterine Fundus: firm Incision: no significant drainage DVT Evaluation: No evidence of DVT seen on physical exam.  Recent Labs    03/20/22 0435  HGB 10.0*  HCT 30.5*    Assessment/Plan: Status post Cesarean section. Doing well postoperatively.  Continue current care. DC in am after staple removal Remove pressure dressing today  Reva Bores, MD 03/21/2022, 10:43 AM

## 2022-03-22 MED ORDER — ACETAMINOPHEN 500 MG PO TABS: 1000.0000 mg | ORAL_TABLET | Freq: Three times a day (TID) | ORAL | 0 refills | Status: DC

## 2022-03-22 MED ORDER — OXYCODONE-ACETAMINOPHEN 5-325 MG PO TABS
1.0000 | ORAL_TABLET | Freq: Once | ORAL | Status: AC
  Administered 2022-03-22: 1 via ORAL
  Filled 2022-03-22: qty 1

## 2022-03-22 MED ORDER — SLYND 4 MG PO TABS
1.0000 | ORAL_TABLET | Freq: Every day | ORAL | 2 refills | Status: DC
Start: 2022-03-22 — End: 2024-05-01

## 2022-03-22 MED ORDER — OXYCODONE HCL 5 MG PO TABS: 5.0000 mg | ORAL_TABLET | ORAL | 0 refills | Status: DC | PRN

## 2022-03-22 MED ORDER — IBUPROFEN 600 MG PO TABS: 600.0000 mg | ORAL_TABLET | Freq: Four times a day (QID) | ORAL | 0 refills | Status: DC

## 2022-03-22 MED ORDER — SENNOSIDES-DOCUSATE SODIUM 8.6-50 MG PO TABS: 2.0000 | ORAL_TABLET | Freq: Every evening | ORAL | 0 refills | Status: DC | PRN

## 2022-03-22 NOTE — Lactation Note (Signed)
This note was copied from a baby's chart. Lactation Consultation Note  Patient Name: Wendy Aguilar'V Date: 03/22/2022 Reason for consult: Follow-up assessment;Infant weight loss Age:23 hours  P3, 10.5% weight loss.  Mother has started supplementing with her breastmilk 30 ml x2.   Set up mother with double electric breast pump w/ 27 flanges. Observed latch with intermittent swallows. Feed on demand with cues.  Goal 8-12+ times per day after first 24 hrs.   Reviewed engorgement care and monitoring voids/stools. Suggest mother continue to supplement with her breastmilk until weight loss stabilizes. Pacifier use not recommended at this time.   Feeding Mother's Current Feeding Choice: Breast Milk  LATCH Score Latch: Grasps breast easily, tongue down, lips flanged, rhythmical sucking.  Audible Swallowing: A few with stimulation  Type of Nipple: Everted at rest and after stimulation  Comfort (Breast/Nipple): Soft / non-tender  Hold (Positioning): Assistance needed to correctly position infant at breast and maintain latch.  LATCH Score: 8   Lactation Tools Discussed/Used Tools: Pump;Flanges Flange Size: 27 Breast pump type: Double-Electric Breast Pump Pump Education: Setup, frequency, and cleaning;Milk Storage Reason for Pumping: stimulation and supplementation Pumping frequency: q 3 hours Pumped volume: 30 mL  Interventions Interventions: Assisted with latch;DEBP;Education;Expressed milk  Discharge Discharge Education: Engorgement and breast care;Warning signs for feeding baby Pump: Advised to call insurance company (Stork pump taken back to due wrong insurance?)  Consult Status Consult Status: Complete Date: 03/22/22    Dahlia Byes [facility] 03/22/2022, 8:28 AM

## 2022-03-24 ENCOUNTER — Encounter: Payer: BC Managed Care – PPO | Admitting: Family Medicine

## 2022-03-24 ENCOUNTER — Other Ambulatory Visit: Payer: BC Managed Care – PPO

## 2022-03-24 ENCOUNTER — Encounter: Payer: BC Managed Care – PPO | Admitting: Obstetrics and Gynecology

## 2022-03-29 ENCOUNTER — Ambulatory Visit: Payer: BC Managed Care – PPO

## 2022-03-29 ENCOUNTER — Telehealth (HOSPITAL_COMMUNITY): Payer: Self-pay | Admitting: *Deleted

## 2022-03-29 NOTE — Telephone Encounter (Signed)
Attempted hospital discharge follow-up call. Left message for patient to return RN call with any questions or concerns. Deforest Hoyles, RN, 03/29/22, [phone]

## 2022-03-29 NOTE — Telephone Encounter (Signed)
Patient returned RN's previous phone call. Patient voiced no questions or concerns regarding her health at this time. EPDS=2. Patient voiced no questions or concerns regarding infant at this time. Patient reports infant sleeps in a crib on her back. RN reviewed ABCs of safe sleep. Patient verbalized understanding. Patient informed about hospital's virtual postpartum classes and support groups. Declined email information at this time. Deforest Hoyles, RN, 03/29/22, [phone]

## 2022-04-09 ENCOUNTER — Ambulatory Visit (INDEPENDENT_AMBULATORY_CARE_PROVIDER_SITE_OTHER): Payer: BC Managed Care – PPO | Admitting: *Deleted

## 2022-04-09 ENCOUNTER — Other Ambulatory Visit: Payer: Self-pay

## 2022-04-09 VITALS — BP 123/75 | HR 78 | Ht 69.0 in | Wt 182.9 lb

## 2022-04-09 DIAGNOSIS — Z4889 Encounter for other specified surgical aftercare: Secondary | ICD-10-CM

## 2022-04-09 NOTE — Progress Notes (Signed)
Here for wound check after c/s 03/19/22. BP wnl. Incision CDI with steristrips and no edema, redness or discharge noted. Planned to remove steristrips but patient refused , states they put in extra strips before she left and told her to leave on extra. Explained steristrips need to be removed to allow incision to remain CDI. Advised to remove one to two steristrips each day after shower Patient agreeable to plan. Reviewed wound care and when to call us. Patient has postpartum visit scheduled. [name]

## 2022-04-22 IMAGING — US US MFM OB FOLLOW-UP
1 series · 13 of 28 positions shown · non-contrast
Comparison: none

[Series 1: us mfm ob follow-up · 13 of 97 slices shown]
[im 4/97]
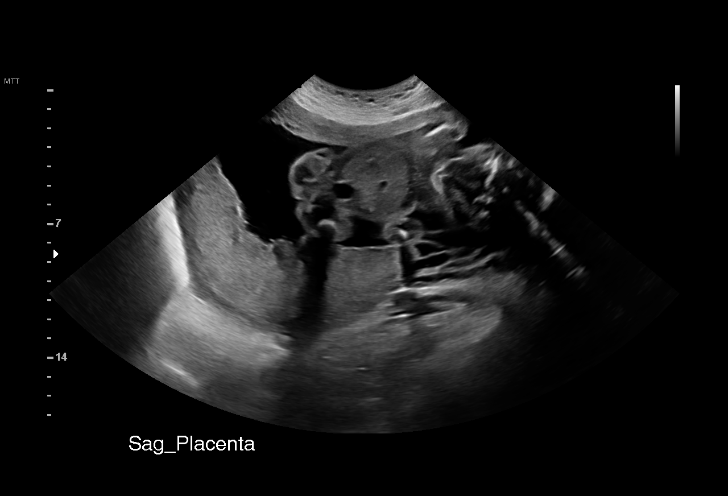
[im 11/97]
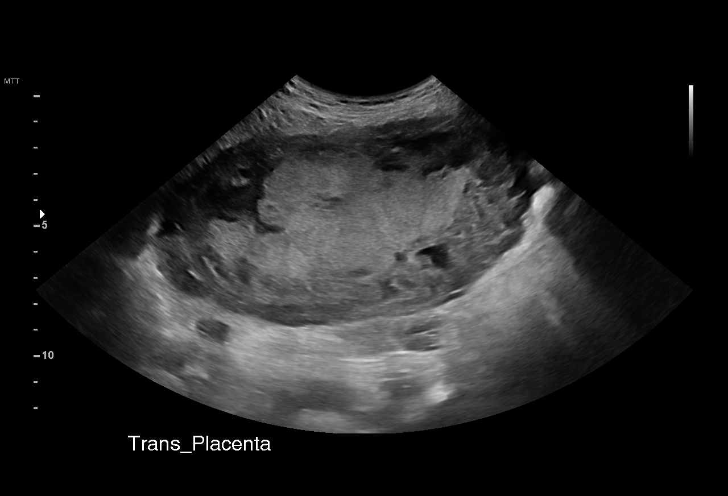
[im 18/97]
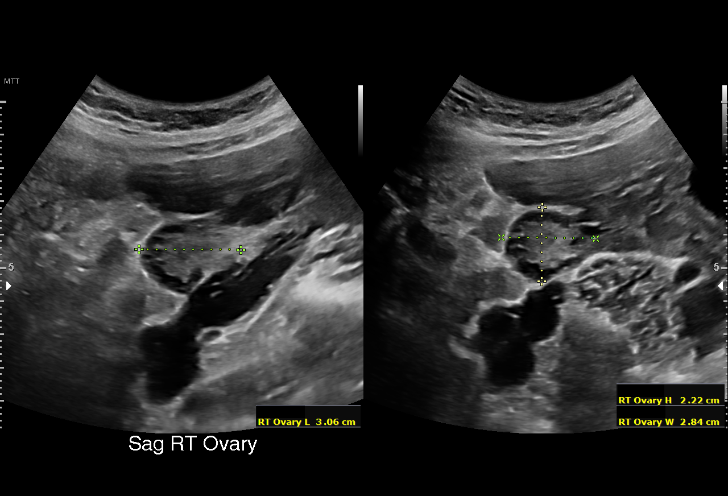
[im 25/97]
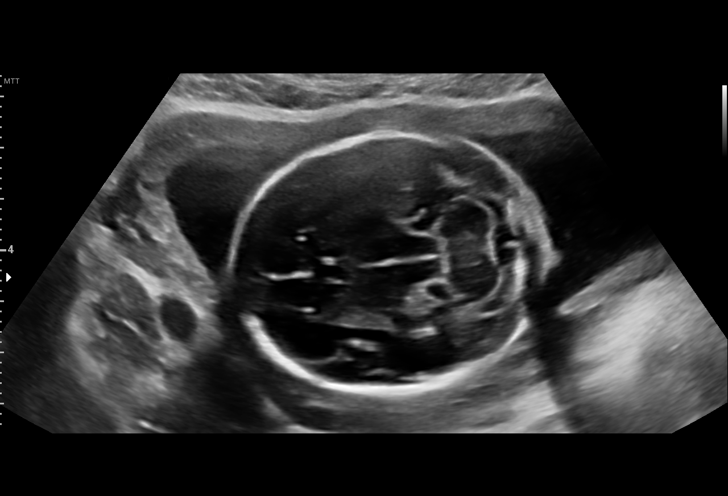
[im 33/97]
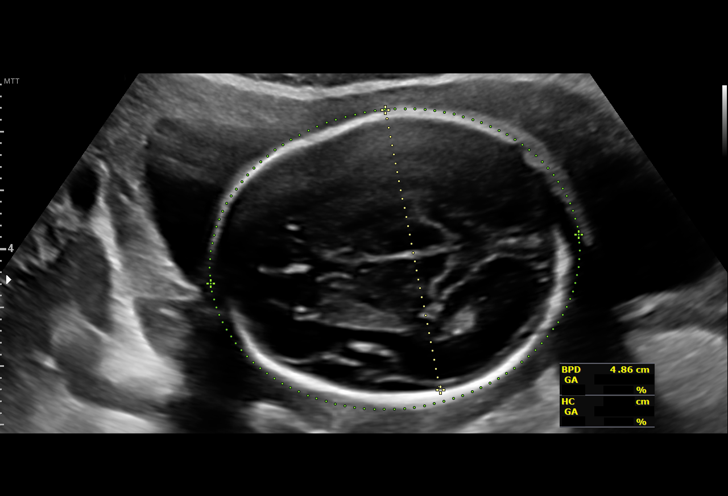
[im 40/97]
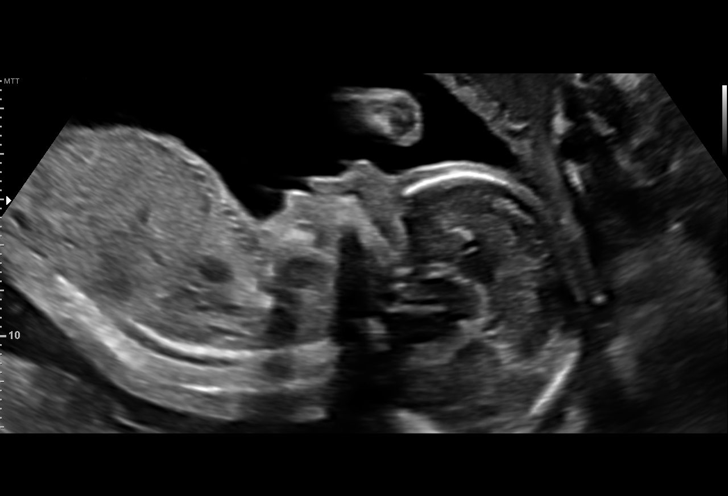
[im 50/97]
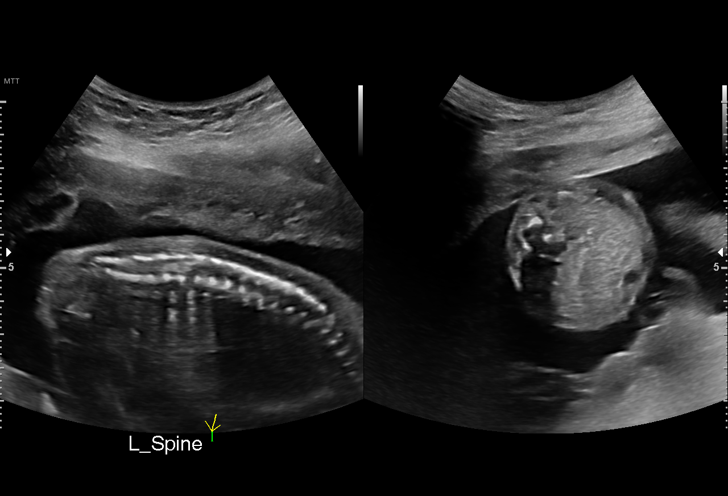
[im 57/97]
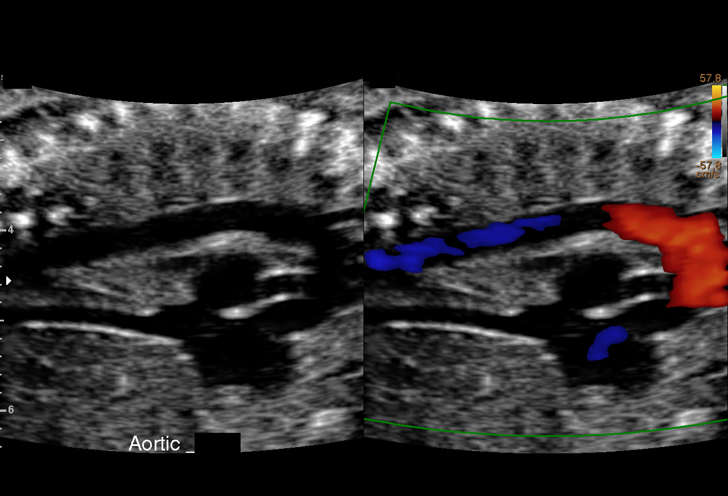
[im 65/97]
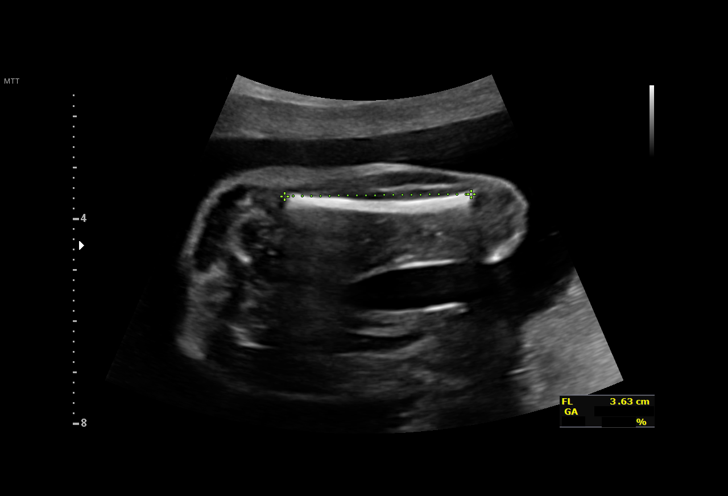
[im 72/97]
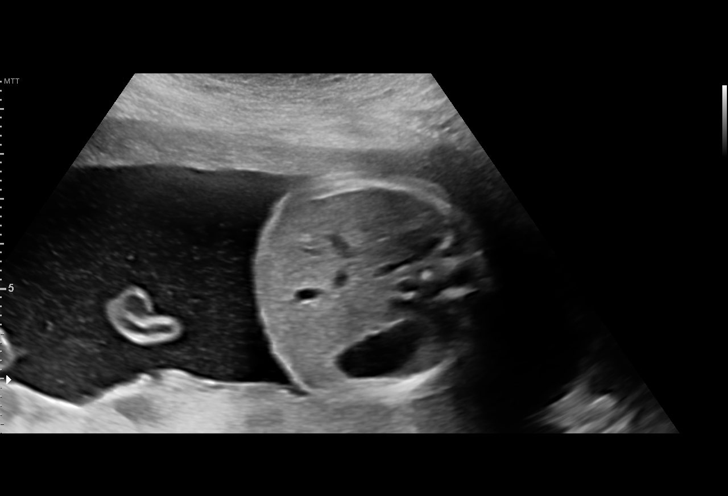
[im 79/97]
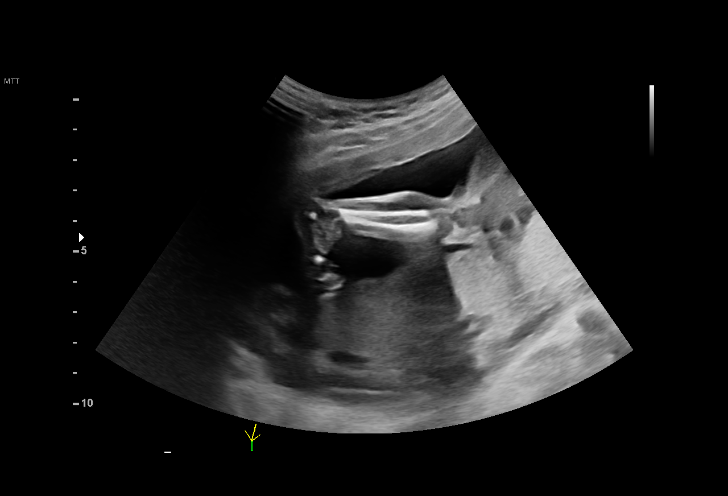
[im 86/97]
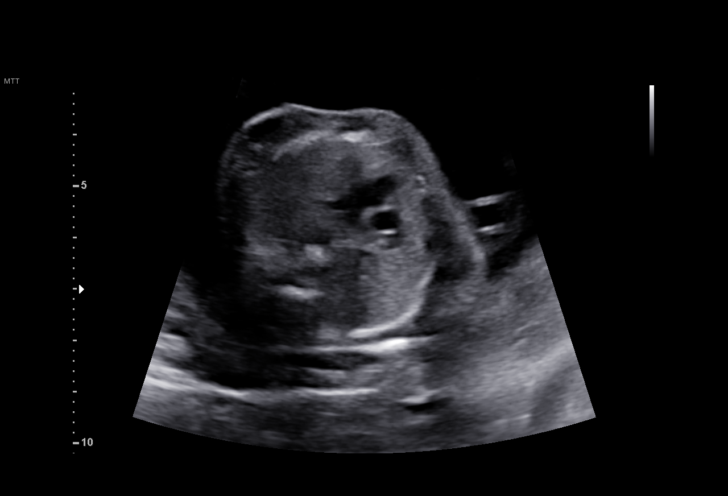
[im 93/97]
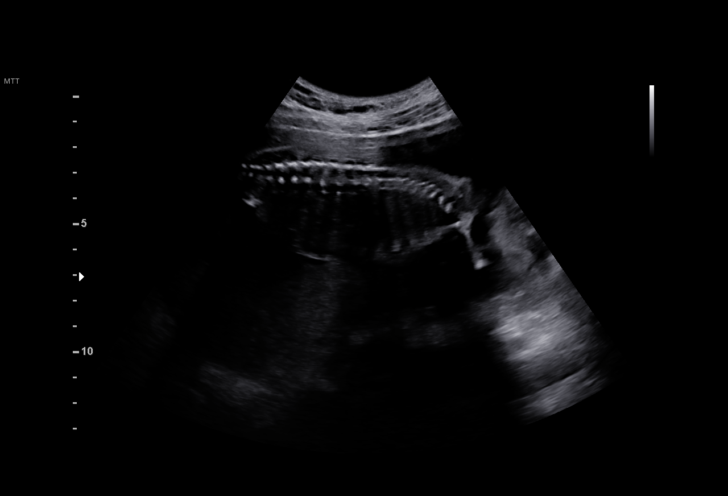

[13 of 28 positions shown; findings below may reference images not displayed]

Indications

 Abnormal fetal ultrasound (absent nasal
 bone)
 21 weeks gestation of pregnancy
 Previous cesarean delivery, antepartum (x2)
 Encounter for other antenatal screening
 follow-up
Fetal Evaluation

 Num Of Fetuses:         1
 Fetal Heart Rate(bpm):  148
 Cardiac Activity:       Observed
 Presentation:           Cephalic
 Placenta:               Posterior
 P. Cord Insertion:      Previously Visualized

 Amniotic Fluid
 AFI FV:      Within normal limits

                             Largest Pocket(cm)

Biometry

 BPD:      48.5  mm     G. Age:  20w 5d         24  %    CI:        73.39   %    70 - 86
                                                         FL/HC:      20.1   %    15.9 -
 HC:      179.9  mm     G. Age:  20w 3d         10  %    HC/AC:      1.05        1.06 -
 AC:      171.6  mm     G. Age:  22w 1d         70  %    FL/BPD:     74.6   %
 FL:       36.2  mm     G. Age:  21w 3d         48  %    FL/AC:      21.1   %    20 - 24
 Est. FW:     440  gm           1 lb     64  %
OB History

 Gravidity:    3         Term:   2
 Living:       2
Gestational Age

 LMP:           23w 1d        Date:  05/31/21                 EDD:   03/07/22
 U/S Today:     21w 1d                                        EDD:   03/21/22
 Best:          21w 2d     Det. By:  U/S  (10/12/21)          EDD:   03/20/22
Anatomy

 Cranium:               Appears normal         LVOT:                   Previously seen
 Cavum:                 Appears normal         Aortic Arch:            Appears normal
 Ventricles:            Appears normal         Ductal Arch:            Previously seen
 Choroid Plexus:        Appears normal         Diaphragm:              Appears normal
 Cerebellum:            Appears normal         Stomach:                Appears normal, left
                                                                       sided
 Posterior Fossa:       Appears normal         Abdomen:                Appears normal
 Nuchal Fold:           Appears normal         Abdominal Wall:         Appears nml (cord
                                                                       insert, abd wall)
 Face:                  Absent nasal bone      Cord Vessels:           Appears normal (3
                                                                       vessel cord)
 Lips:                  Appears normal         Kidneys:                Appear normal
 Palate:                Not well visualized    Bladder:                Appears normal
 Thoracic:              Appears normal         Spine:                  Appears normal
 Heart:                 Appears normal         Upper Extremities:      Previously seen
                        (4CH, axis, and
                        situs)
 RVOT:                  Previously seen        Lower Extremities:      Appears normal

 Other:  Female gender previously seen. Heels/feet, open hands/5th digits,
         VC, 3VV visualized previously.
Cervix Uterus Adnexa

 Cervix
 Normal appearance by transabdominal scan.

 Uterus
 No abnormality visualized.

 Right Ovary
 Within normal limits.

 Left Ovary
 Within normal limits.

 Cul De Sac
 No free fluid seen.

 Adnexa
 No abnormality visualized.
Comments

 This patient was seen for a follow up exam as an absent
 nasal bone was noted during her prior exam.  She denies any
 problems since her last exam.
 She had a cell free DNA test drawn that indicated a low risk
 for trisomy 21, 18, and 13.  A female fetus is predicted.
 She was informed that the fetal growth and amniotic fluid
 level appears appropriate for her gestational age.  The fetal
 biometry measurements confirms her EDC March 20, 2022.
 An absent fetal nasal bone continues to be noted on today's
 exam.  The patient was reassured that an absent nasal bone
 is often noted in fetuses of African-American women and
 therefore is most likely a normal variant.

 The small association of an absent nasal bone with Down
 syndrome was discussed.  Due to this finding, she was
 offered and declined an amniocentesis for definitive
 diagnosis of fetal aneuploidy.  She is comfortable with her
 negative cell free DNA test.
 She will return in 7 weeks for a follow-up growth scan.

## 2022-04-28 ENCOUNTER — Ambulatory Visit (INDEPENDENT_AMBULATORY_CARE_PROVIDER_SITE_OTHER): Payer: BC Managed Care – PPO | Admitting: Certified Nurse Midwife

## 2022-04-28 ENCOUNTER — Other Ambulatory Visit: Payer: Self-pay

## 2022-04-28 DIAGNOSIS — Z3009 Encounter for other general counseling and advice on contraception: Secondary | ICD-10-CM | POA: Diagnosis not present

## 2022-04-28 LAB — POCT PREGNANCY, URINE: Preg Test, Ur: NEGATIVE

## 2022-04-28 MED ORDER — NORELGESTROMIN-ETH ESTRADIOL 150-35 MCG/24HR TD PTWK: 1.0000 | MEDICATED_PATCH | TRANSDERMAL | 12 refills | Status: DC

## 2022-04-28 NOTE — Progress Notes (Unsigned)
[place] Partum Visit Note  Wendy Aguilar is a 21 y.o. G10P3003 female who presents for a postpartum visit. She is 5 weeks postpartum following a repeat cesarean section.  I have fully reviewed the prenatal and intrapartum course. The delivery was at [redacted]w[redacted]d gestational weeks.  Anesthesia: spinal. Postpartum course has been well. Baby is doing well. Baby is feeding by breast. Bleeding no bleeding. Bowel function is normal. Bladder function is normal. Patient is sexually active. Contraception method is oral progesterone-only contraceptive. Postpartum depression screening: negative.  The pregnancy intention screening data noted above was reviewed. Potential methods of contraception were discussed. The patient elected to proceed with Contraceptive Patch.   Edinburgh Postnatal Depression Scale - 04/28/22 1530       Edinburgh Postnatal Depression Scale:  In the Past 7 Days   I have been able to laugh and see the funny side of things. 0    I have looked forward with enjoyment to things. 0    I have blamed myself unnecessarily when things went wrong. 0    I have been anxious or worried for no good reason. 1    I have felt scared or panicky for no good reason. 0    Things have been getting on top of me. 0    I have been so unhappy that I have had difficulty sleeping. 0    I have felt sad or miserable. 1    I have been so unhappy that I have been crying. 1    The thought of harming myself has occurred to me. 0    Edinburgh Postnatal Depression Scale Total 3            Health Maintenance Due  Topic Date Due   COVID-19 Vaccine (1) Never done   HPV VACCINES (1 - 2-dose series) Never done   INFLUENZA VACCINE  03/02/2022   The following portions of the patient's history were reviewed and updated as appropriate: allergies, current medications, past family history, past medical history, past social history, past surgical history, and problem list.  Review of Systems Pertinent items noted in  HPI and remainder of comprehensive ROS otherwise negative.  Objective:  BP 126/69   Pulse (!) 48   LMP 05/31/2021 (Exact Date)   Breastfeeding Yes    Constitutional: Alert, oriented female in no physical distress.  HEENT: PERRLA Skin: normal color and turgor, no rash Cardiovascular: normal rate & rhythm, warm and well perfused Respiratory: normal effort, no problems with respiration noted GI: Abd soft, non-tender MS: Extremities nontender, no edema, normal ROM Neurologic: Alert and oriented x 4.  GU: no CVA tenderness Pelvic: exam deferred Breasts: normal lactating breasts, no problems reported Incision: well-approximated without exudate, redness or edema.  Assessment:  1. Postpartum care and examination - Feeling well, negative pregnancy test  2. Birth control counseling - Breastfeeding so progestin only method preferred but pt declines anything insertable or depo, strongly prefers the patch since she has used it well in the past - Breastfeeding well established with good supply. Recommended starting a Legendairy Milk supplement when she begins the patch. - Advised to use begin the patch this weekend once she is past 6wks postpartum. Pt aware and agreed to plan of care.  Plan:   Essential components of care per ACOG recommendations:  1.  Mood and well being: Patient with negative depression screening today. Reviewed local resources for support.  - Patient tobacco use? No.   - hx of drug use? No.  2. Infant care and feeding:  -Patient currently breastmilk feeding? Yes. Discussed returning to work and pumping. Reviewed importance of draining breast regularly to support lactation.  -Social determinants of health (SDOH) reviewed in EPIC. No concerns  3. Sexuality, contraception and birth spacing - Patient does not want a pregnancy in the next year.  Desired family size is 3 children.  - Reviewed reproductive life planning. Reviewed contraceptive methods based on pt  preferences and effectiveness.  Patient desired Contraceptive Patch today.   - Discussed birth spacing of 18 months  4. Sleep and fatigue -Encouraged family/partner/community support of 4 hrs of uninterrupted sleep to help with mood and fatigue  5. Physical Recovery  - Discussed patients delivery and complications. She describes her labor as good. - Patient had a Vaginal, no problems at delivery. Patient had  no  laceration. Perineal healing reviewed. Patient expressed understanding - Patient has urinary incontinence? No. - Patient is safe to resume physical and sexual activity  6.  Health Maintenance - HM due items addressed Yes - Last pap smear  Diagnosis  Date Value Ref Range Status  10/14/2021 (A)  Final   - Atypical squamous cells of undetermined significance (ASC-US)   Pap smear not done at today's visit. Per ASCCP, follow up recommended is pap in 2026 -Breast Cancer screening indicated? No.   7. Chronic Disease/Pregnancy Condition follow up: None - PCP follow up as needed  Bernerd Limbo, CNM [facility] [employer], [facility]

## 2022-04-28 NOTE — Patient Instructions (Addendum)
[email or web address] - try [place] or [place] [phone]) [facility] at [place], [place], [place] [address] [phone] Mon-Fri 8:00-5:30  [place] [phone]) [facility] at [place], [place], [place] [address] [phone] Mon-Fri 8:00-5:00  [place] [phone]) [place] at [place], [place], [place] [address] [phone] Mon-Fri 8:00-5:00 [facility] at [place], [place], [place] [address] [phone] Mon-Fri 8:00-5:00 [place] at [facility] [address] [place], [place] [address] [phone] Mon-Fri 8:00-5:00   [place] [phone] & [phone]) [place] at [place]. [address] [place], [place]  [address] [phone]   Fax - [phone]) [facility] [address] [place], [place], [place] [address] [phone] Mon-Fri 8:00-5:00 Accepting Medicaid   [place] [phone]) [place] at [facility] [address] [place], [place], [place] [address] [phone] Mon-Fri 8:00-5:00

## 2022-06-14 ENCOUNTER — Telehealth: Payer: BC Managed Care – PPO | Admitting: Physician Assistant

## 2022-06-14 DIAGNOSIS — J029 Acute pharyngitis, unspecified: Secondary | ICD-10-CM | POA: Diagnosis not present

## 2022-06-14 DIAGNOSIS — U071 COVID-19: Secondary | ICD-10-CM

## 2022-06-14 MED ORDER — PROMETHAZINE-DM 6.25-15 MG/5ML PO SYRP: 5.0000 mL | ORAL_SOLUTION | Freq: Four times a day (QID) | ORAL | 0 refills | Status: DC | PRN

## 2022-06-14 MED ORDER — LIDOCAINE VISCOUS HCL 2 % MT SOLN: OROMUCOSAL | 0 refills | Status: DC

## 2022-06-14 NOTE — Progress Notes (Signed)
E-Visit  for Positive Covid Test Result  We are sorry you are not feeling well. We are here to help!  You have tested positive for COVID-19, meaning that you were infected with the novel coronavirus and could give the virus to others.  It is vitally important that you stay home so you do not spread it to others.      Please continue isolation at home, for at least 10 days since the start of your symptoms and until you have had 24 hours with no fever (without taking a fever reducer) and with improving of symptoms.  If you have no symptoms but tested positive (or all symptoms resolve after 5 days and you have no fever) you can leave your house but continue to wear a mask around others for an additional 5 days. If you have a fever,continue to stay home until you have had 24 hours of no fever. Most cases improve 5-10 days from onset but we have seen a small number of patients who have gotten worse after the 10 days.  Please be sure to watch for worsening symptoms and remain taking the proper precautions.   Go to the nearest hospital ED for assessment if fever/cough/breathlessness are severe or illness seems like a threat to life.    The following symptoms may appear 2-14 days after exposure: Fever Cough Shortness of breath or difficulty breathing Chills Repeated shaking with chills Muscle pain Headache Sore throat New loss of taste or smell Fatigue Congestion or runny nose Nausea or vomiting Diarrhea  You have been enrolled in MyChart Home Monitoring for COVID-19. Daily you will receive a questionnaire within the MyChart website. Our COVID-19 response team will be monitoring your responses daily.  You can use medication such as Viscous lidocaine Take 74mL every 4 hours as needed for sore throat; Promethazine DM cough syrup Take 30mL every 6 hours as needed for cough.   You may also take acetaminophen (Tylenol) as needed for fever.  HOME CARE: Only take medications as instructed by  your medical team. Drink plenty of fluids and get plenty of rest. A steam or ultrasonic humidifier can help if you have congestion.   GET HELP RIGHT AWAY IF YOU HAVE EMERGENCY WARNING SIGNS.  Call 911 or proceed to your closest emergency facility if: You develop worsening high fever. Trouble breathing Bluish lips or face Persistent pain or pressure in the chest New confusion Inability to wake or stay awake You cough up blood. Your symptoms become more severe Inability to hold down food or fluids  This list is not all possible symptoms. Contact your medical provider for any symptoms that are severe or concerning to you.    Your e-visit answers were reviewed by a board certified advanced clinical practitioner to complete your personal care plan.  Depending on the condition, your plan could have included both over the counter or prescription medications.  If there is a problem please reply once you have received a response from your provider.  Your safety is important to [place].  If you have drug allergies check your prescription carefully.    You can use MyChart to ask questions about today's visit, request a non-urgent call back, or ask for a work or school excuse for 24 hours related to this e-Visit. If it has been greater than 24 hours you will need to follow up with your provider, or enter a new e-Visit to address those concerns. You will get an e-mail in the next  two days asking about your experience.  I hope that your e-visit has been valuable and will speed your recovery. Thank you for using e-visits.  I have spent 5 minutes in review of e-visit questionnaire, review and updating patient chart, medical decision making and response to patient.   Jahzara Slattery M Kempton [place], PA-C  

## 2022-06-20 ENCOUNTER — Emergency Department (HOSPITAL_COMMUNITY)
Admission: EM | Admit: 2022-06-20 | Discharge: 2022-06-21 | Disposition: A | Discharge status: Home / Self Care | Payer: BC Managed Care – PPO | Attending: Emergency Medicine | Admitting: Emergency Medicine

## 2022-06-20 ENCOUNTER — Other Ambulatory Visit: Payer: Self-pay

## 2022-06-20 ENCOUNTER — Encounter (HOSPITAL_COMMUNITY): Payer: Self-pay | Admitting: Emergency Medicine

## 2022-06-20 DIAGNOSIS — R519 Headache, unspecified: Secondary | ICD-10-CM | POA: Diagnosis not present

## 2022-06-20 DIAGNOSIS — R001 Bradycardia, unspecified: Secondary | ICD-10-CM | POA: Diagnosis not present

## 2022-06-20 DIAGNOSIS — R0602 Shortness of breath: Secondary | ICD-10-CM | POA: Insufficient documentation

## 2022-06-20 DIAGNOSIS — R42 Dizziness and giddiness: Secondary | ICD-10-CM | POA: Insufficient documentation

## 2022-06-20 DIAGNOSIS — R202 Paresthesia of skin: Secondary | ICD-10-CM | POA: Diagnosis not present

## 2022-06-20 DIAGNOSIS — R739 Hyperglycemia, unspecified: Secondary | ICD-10-CM | POA: Diagnosis not present

## 2022-06-20 LAB — CBC WITH DIFFERENTIAL/PLATELET
Abs Immature Granulocytes: 0.03 10*3/uL (ref 0.00–0.07)
Basophils Absolute: 0.1 10*3/uL (ref 0.0–0.1)
Basophils Relative: 1 %
Eosinophils Absolute: 0.2 10*3/uL (ref 0.0–0.5)
Eosinophils Relative: 2 %
HCT: 39.7 % (ref 36.0–46.0)
Hemoglobin: 12.5 g/dL (ref 12.0–15.0)
Immature Granulocytes: 0 %
Lymphocytes Relative: 46 %
Lymphs Abs: 4.2 10*3/uL — ABNORMAL HIGH (ref 0.7–4.0)
MCH: 26.9 pg (ref 26.0–34.0)
MCHC: 31.5 g/dL (ref 30.0–36.0)
MCV: 85.6 fL (ref 80.0–100.0)
Monocytes Absolute: 0.5 10*3/uL (ref 0.1–1.0)
Monocytes Relative: 6 %
Neutro Abs: 4.1 10*3/uL (ref 1.7–7.7)
Neutrophils Relative %: 45 %
Platelets: 220 10*3/uL (ref 150–400)
RBC: 4.64 MIL/uL (ref 3.87–5.11)
RDW: 12.6 % (ref 11.5–15.5)
WBC: 9 10*3/uL (ref 4.0–10.5)
nRBC: 0 % (ref 0.0–0.2)

## 2022-06-20 LAB — BASIC METABOLIC PANEL
Anion gap: 16 — ABNORMAL HIGH (ref 5–15)
BUN: 8 mg/dL (ref 6–20)
CO2: 23 mmol/L (ref 22–32)
Calcium: 9.4 mg/dL (ref 8.9–10.3)
Chloride: 103 mmol/L (ref 98–111)
Creatinine, Ser: 0.77 mg/dL (ref 0.44–1.00)
GFR, Estimated: >60 mL/min (ref >60)
Glucose, Bld: 158 mg/dL — ABNORMAL HIGH (ref 70–99)
Potassium: 3.1 mmol/L — ABNORMAL LOW (ref 3.5–5.1)
Sodium: 142 mmol/L (ref 135–145)

## 2022-06-20 LAB — I-STAT BETA HCG BLOOD, ED (MC, WL, AP ONLY): I-stat hCG, quantitative: <5 m[IU]/mL (ref <5)

## 2022-06-20 MED ORDER — IBUPROFEN 400 MG PO TABS
600.0000 mg | ORAL_TABLET | Freq: Once | ORAL | Status: AC
  Administered 2022-06-21: 600 mg via ORAL
  Filled 2022-06-20: qty 1

## 2022-06-20 MED ORDER — POTASSIUM CHLORIDE CRYS ER 20 MEQ PO TBCR
40.0000 meq | EXTENDED_RELEASE_TABLET | Freq: Once | ORAL | Status: AC
  Administered 2022-06-21: 40 meq via ORAL
  Filled 2022-06-20: qty 2

## 2022-06-20 MED ORDER — SODIUM CHLORIDE 0.9 % IV BOLUS
1000.0000 mL | Freq: Once | INTRAVENOUS | Status: AC
  Administered 2022-06-21: 1000 mL via INTRAVENOUS

## 2022-06-20 NOTE — ED Notes (Signed)
Patient currently refusing to get out of recliner and into hospital bed

## 2022-06-20 NOTE — ED Triage Notes (Signed)
Pt brought to ED by [facility] with c/o dizziness after feeding child at home. Pt reports sudden onset of "hot feeling", nausea, dizziness and numbness to hands. Pt also noted to hyperventilate with EMS and vomit. Zofran 4mg  IV.   EMS Interventions 20g RAC Zofran 4mg 

## 2022-06-20 NOTE — ED Provider Notes (Signed)
[facility] EMERGENCY DEPARTMENT Provider Note   CSN: [id number] Arrival date & time: 06/20/22  2112     History {Add pertinent medical, surgical, social history, OB history to HPI:1} Chief Complaint  Patient presents with   Dizziness    Wendy Aguilar is a 21 y.o. female.  HPI   Patient with medical history including status post cesarean section 39 weeks without complications, delivered on 08/18, presenting with dizziness episode.  Patient states that this happened suddenly, states that she was feeding her child and then she started develop some shortness of breath, paresthesias in the upper and lower extremities, she states that this is since resolved, but now she has a slight headache, states it not the worst headache of her life, feels just like a normal headache, she does have some photosensitivity feels slightly dizzy when she she looks to light, she denies any head trauma, not anticoag's, no history of gestational diabetes or preeclampsia, she currently  no other complaints at this time.  She does note that she has had this in the past so that she had a panic attack.  Currently not diagnosed with anxiety.  Says she is under a lot of stress taking care of her newborn as well as 2 other children.    Home Medications Prior to Admission medications   Medication Sig Start Date End Date Taking? Authorizing Provider  ibuprofen (ADVIL) 600 MG tablet Take 1 tablet (600 mg total) by mouth every 6 (six) hours. 03/22/22  Yes Mercado-Ortiz, Lahoma Crocker, DO  acetaminophen (TYLENOL) 500 MG tablet Take 2 tablets (1,000 mg total) by mouth every 8 (eight) hours. Patient not taking: Reported on 04/28/2022 03/22/22   Mercado-Ortiz, Lahoma Crocker, DO  Drospirenone (SLYND) 4 MG TABS Take 1 tablet by mouth daily. Patient not taking: Reported on 06/20/2022 03/22/22 04/21/22  Myrtie Hawk, DO  lidocaine (XYLOCAINE) 2 % solution Swallow 34mL every 4-6 hours as needed for sore  throat Patient not taking: Reported on 06/20/2022 06/14/22   Margaretann Loveless, PA-C  norelgestromin-ethinyl estradiol [facility]) 150-35 MCG/24HR transdermal patch Place 1 patch onto the skin once a week. Patient not taking: Reported on 06/20/2022 04/28/22   Bernerd Limbo, CNM  oxyCODONE (OXY IR/ROXICODONE) 5 MG immediate release tablet Take 1-2 tablets (5-10 mg total) by mouth every 4 (four) hours as needed for moderate pain. Patient not taking: Reported on 04/28/2022 03/22/22   Myrtie Hawk, DO  prenatal vitamin w/FE, FA (PRENATAL 1 + 1) 27-1 MG TABS tablet Take 1 tablet by mouth daily at 12 noon. Patient not taking: Reported on 06/20/2022 07/29/21   Worthy Rancher, MD  promethazine-dextromethorphan (PROMETHAZINE-DM) 6.25-15 MG/5ML syrup Take 5 mLs by mouth 4 (four) times daily as needed. Patient not taking: Reported on 06/20/2022 06/14/22   Margaretann Loveless, PA-C  senna-docusate (SENOKOT-S) 8.6-50 MG tablet Take 2 tablets by mouth at bedtime as needed for mild constipation. Patient not taking: Reported on 04/28/2022 03/22/22   Myrtie Hawk, DO      Allergies    Patient has no known allergies.    Review of Systems   Review of Systems  Constitutional:  Negative for chills and fever.  Eyes:  Positive for photophobia.  Respiratory:  Negative for shortness of breath.   Cardiovascular:  Negative for chest pain.  Gastrointestinal:  Negative for abdominal pain.  Neurological:  Positive for dizziness and headaches.    Physical Exam Updated Vital Signs BP (!) 145/89   Pulse Marland Kitchen)  50   Temp 97.7 F (36.5 C)   Resp 18   SpO2 100%  Physical Exam Vitals and nursing note reviewed.  Constitutional:      General: She is not in acute distress.    Appearance: She is not ill-appearing.  HENT:     Head: Normocephalic and atraumatic.     Nose: No congestion.  Eyes:     Extraocular Movements: Extraocular movements intact.     Conjunctiva/sclera: Conjunctivae  normal.     Pupils: Pupils are equal, round, and reactive to light.  Cardiovascular:     Rate and Rhythm: Normal rate and regular rhythm.     Pulses: Normal pulses.     Heart sounds: No murmur heard.    No friction rub. No gallop.  Musculoskeletal:     Right lower leg: No edema.     Left lower leg: No edema.  Skin:    General: Skin is warm and dry.  Neurological:     Mental Status: She is alert.     GCS: GCS eye subscore is 4. GCS verbal subscore is 5. GCS motor subscore is 6.     Cranial Nerves: Cranial nerves 2-12 are intact.     Sensory: Sensation is intact.     Motor: No weakness.     Coordination: Coordination is intact.     Comments: No facial asymmetry no difficulty with word finding fine two-step commands there is no unilateral weakness present during my examination.  Psychiatric:        Mood and Affect: Mood normal.     ED Results / Procedures / Treatments   Labs (all labs ordered are listed, but only abnormal results are displayed) Labs Reviewed  BASIC METABOLIC PANEL - Abnormal; Notable for the following components:      Result Value   Potassium 3.1 (*)    Glucose, Bld 158 (*)    Anion gap 16 (*)    All other components within normal limits  CBC WITH DIFFERENTIAL/PLATELET - Abnormal; Notable for the following components:   Lymphs Abs 4.2 (*)    All other components within normal limits  URINALYSIS, ROUTINE W REFLEX MICROSCOPIC  BLOOD GAS, VENOUS  HEPATIC FUNCTION PANEL  MAGNESIUM  I-STAT BETA HCG BLOOD, ED ([facility], [facility], [facility] ONLY)  CBG MONITORING, ED    EKG None  Radiology No results found.  Procedures Procedures  {Document cardiac monitor, telemetry assessment procedure when appropriate:1}  Medications Ordered in ED Medications  sodium chloride 0.9 % bolus 1,000 mL (has no administration in time range)  ibuprofen (ADVIL) tablet 600 mg (has no administration in time range)  potassium chloride SA (KLOR-CON M) CR tablet 40 mEq (has no administration in  time range)    ED Course/ Medical Decision Making/ A&P                           Medical Decision Making Amount and/or Complexity of Data Reviewed Labs: ordered.  Risk Prescription drug management.   This patient presents to the ED for concern of dizziness, this involves an extensive number of treatment options, and is a complaint that carries with it a high risk of complications and morbidity.  The differential diagnosis includes CVA, helpp syndrome, metabolic derailments, ACS    Additional history obtained:  Additional history obtained from N/A External records from outside source obtained and reviewed including OB/GYN notes   Co morbidities that complicate the patient evaluation  N/A  Social  Determinants of Health:  N/A    Lab Tests:  I Ordered, and personally interpreted labs.  The pertinent results include: CBC is unremarkable, BMP shows potassium 3.1, glucose 158, anion gap 16,   Imaging Studies ordered:  I ordered imaging studies including ***  I independently visualized and interpreted imaging which showed *** I agree with the radiologist interpretation   Cardiac Monitoring:  The patient was maintained on a cardiac monitor.  I personally viewed and interpreted the cardiac monitored which showed an underlying rhythm of: ***   Medicines ordered and prescription drug management:  I ordered medication including fluids, ibuprofen, potassium I have reviewed the patients home medicines and have made adjustments as needed  Critical Interventions:  ***   Reevaluation:  Presents with dizziness, triage obtain basic lab work-up which I personally reviewed, does have an anion gap with a slightly elevated glucose, due to her postpartum state and concern for possible help syndrome, will add on hepatic panel, she has low potassium we will add on a magnesium, add on VBG help rule out DKA    Consultations Obtained:  I requested consultation with the ***,   and discussed lab and imaging findings as well as pertinent plan - they recommend: ***    Test Considered:  ***    Rule out ****    Dispostion and problem list  After consideration of the diagnostic results and the patients response to treatment, I feel that the patent would benefit from ***.       {Document critical care time when appropriate:1} {Document review of labs and clinical decision tools ie heart score, Chads2Vasc2 etc:1}  {Document your independent review of radiology images, and any outside records:1} {Document your discussion with family members, caretakers, and with consultants:1} {Document social determinants of health affecting pt's care:1} {Document your decision making why or why not admission, treatments were needed:1} Final Clinical Impression(s) / ED Diagnoses Final diagnoses:  None    Rx / DC Orders ED Discharge Orders     None

## 2022-06-20 NOTE — ED Triage Notes (Signed)
This RN at bedside to triage pt. Pt initially presents as uninterested in triage process and agitated with questioning stating "I'm tired and dizzy". Pt also noted to force herself to loudly heave and regurgitate saliva during triage process, however easily soothed after being provided with reclining chair and warm blanket. Reports having stressors and "just needing sleep" stating she has a newborn at home and alterations in sleep but has good maternal/infant bonding. Pt also states that she recently had COVID last week.

## 2022-06-20 NOTE — ED Provider Triage Note (Signed)
Emergency Medicine Provider Triage Evaluation Note  Wendy Aguilar , a 21 y.o. female  was evaluated in triage.  Pt complains of dizziness onset PTA. Per EMS, patient notes that she was feeding her child when she became high at, hyperventilating, tingling in bilateral fingers. Pt was diagnosed with covid 1.5 weeks ago and at that time she had GI sx. Has associated epigastric abdominal pain. Given 4 mg of Zofran by EMS prior to arrival with mild relief of her symptoms. Pt notes that she just gave birth in August and notes that she is feeling overwhelmed at home, however, notes that she does have support at home and has been bonding well with her baby. Denies urinary symptoms.  Review of Systems  Positive:  Negative:   Physical Exam  BP (!) 145/89   Pulse (!) 50   Temp 97.7 F (36.5 C)   Resp 18   SpO2 100%  Gen:   Awake, no distress   Resp:  Normal effort  MSK:   Moves extremities without difficulty  Other:  Pt with emesis in triage  Medical Decision Making  Medically screening exam initiated at 9:37 PM.  Appropriate orders placed.  Wendy A Grobe was informed that the remainder of the evaluation will be completed by another provider, this initial triage assessment does not replace that evaluation, and the importance of remaining in the ED until their evaluation is complete.  Work-up initiated.    Miyoshi Ligas A, PA-C 06/20/22 [id number]

## 2022-06-21 DIAGNOSIS — R42 Dizziness and giddiness: Secondary | ICD-10-CM | POA: Diagnosis not present

## 2022-06-21 LAB — BASIC METABOLIC PANEL
Anion gap: 10 (ref 5–15)
BUN: 7 mg/dL (ref 6–20)
CO2: 26 mmol/L (ref 22–32)
Calcium: 8.9 mg/dL (ref 8.9–10.3)
Chloride: 106 mmol/L (ref 98–111)
Creatinine, Ser: 0.73 mg/dL (ref 0.44–1.00)
GFR, Estimated: >60 mL/min (ref >60)
Glucose, Bld: 102 mg/dL — ABNORMAL HIGH (ref 70–99)
Potassium: 4.6 mmol/L (ref 3.5–5.1)
Sodium: 142 mmol/L (ref 135–145)

## 2022-06-21 LAB — I-STAT VENOUS BLOOD GAS, ED
Acid-base deficit: 1 mmol/L (ref 0.0–2.0)
Bicarbonate: 25.7 mmol/L (ref 20.0–28.0)
Calcium, Ion: 1.16 mmol/L (ref 1.15–1.40)
HCT: 39 % (ref 36.0–46.0)
Hemoglobin: 13.3 g/dL (ref 12.0–15.0)
O2 Saturation: 100 %
Potassium: 3 mmol/L — ABNORMAL LOW (ref 3.5–5.1)
Sodium: 140 mmol/L (ref 135–145)
TCO2: 27 mmol/L (ref 22–32)
pCO2, Ven: 48.8 mmHg (ref 44–60)
pH, Ven: 7.331 (ref 7.25–7.43)
pO2, Ven: 207 mmHg — ABNORMAL HIGH (ref 32–45)

## 2022-06-21 LAB — URINALYSIS, ROUTINE W REFLEX MICROSCOPIC
Bilirubin Urine: NEGATIVE
Glucose, UA: NEGATIVE mg/dL
Hgb urine dipstick: NEGATIVE
Ketones, ur: NEGATIVE mg/dL
Leukocytes,Ua: NEGATIVE
Nitrite: NEGATIVE
Protein, ur: NEGATIVE mg/dL
Specific Gravity, Urine: 1.009 (ref 1.005–1.030)
pH: 7 (ref 5.0–8.0)

## 2022-06-21 LAB — HEPATIC FUNCTION PANEL
ALT: 14 U/L (ref 0–44)
AST: 20 U/L (ref 15–41)
Albumin: 3.7 g/dL (ref 3.5–5.0)
Alkaline Phosphatase: 56 U/L (ref 38–126)
Bilirubin, Direct: 0.2 mg/dL (ref 0.0–0.2)
Indirect Bilirubin: 0.1 mg/dL — ABNORMAL LOW (ref 0.3–0.9)
Total Bilirubin: 0.3 mg/dL (ref 0.3–1.2)
Total Protein: 7.6 g/dL (ref 6.5–8.1)

## 2022-06-21 LAB — CBG MONITORING, ED: Glucose-Capillary: 145 mg/dL — ABNORMAL HIGH (ref 70–99)

## 2022-06-21 LAB — MAGNESIUM: Magnesium: 1.8 mg/dL (ref 1.7–2.4)

## 2022-06-21 NOTE — Discharge Instructions (Signed)
Suspect her symptoms are from anxiety, please maintain plenty of sleep, decrease in stress, maintain a healthy diet.  Recommend following up with your PCP for further evaluation  Your blood sugar slightly elevated today, I would like you to follow-up with your primary care doctor for further assessment.  Come back to the emergency department if you develop chest pain, shortness of breath, severe abdominal pain, uncontrolled nausea, vomiting, diarrhea.

## 2022-06-21 NOTE — ED Notes (Signed)
Pt ambulated to restroom, urine specimen cup provided.

## 2022-06-29 NOTE — Progress Notes (Unsigned)
ANNUAL EXAM Patient name: Wendy Aguilar MRN [id number]  Date of birth: 03/05/2001 Chief Complaint:   No chief complaint on file.  History of Present Illness:   Wendy Aguilar is a 21 y.o. G10P3003 African-American female being seen today for a routine annual exam.  Current complaints: ***  No LMP recorded.   The pregnancy intention screening data noted above was reviewed. Potential methods of contraception were discussed. The patient elected to proceed with No data recorded.   Last pap Never (age). Results were: N/A. H/O abnormal pap: {yes/yes***/no:23866} Last mammogram: Never (age). Results were: N/A. Family h/o breast cancer: {yes***/no:23838} Last colonoscopy: Never (age). Results were: N/A. Family h/o colorectal cancer: {yes***/no:23838}     03/10/2022    3:50 PM 02/24/2022    2:56 PM 01/27/2022    2:43 PM 01/13/2022    9:32 AM 11/04/2021    4:26 PM  Depression screen PHQ 2/9  Decreased Interest 0 0 0 0 0  Down, Depressed, Hopeless 0 0 0 0 0  PHQ - 2 Score 0 0 0 0 0  Altered sleeping 0 0 0 0 0  Tired, decreased energy 0 0 0 0 0  Change in appetite 0 0 0 0 0  Feeling bad or failure about yourself  0 0 0 0 0  Trouble concentrating 0 0 0 0 0  Moving slowly or fidgety/restless 0 0 0 0 0  Suicidal thoughts 0 0 0 0 0  PHQ-9 Score 0 0 0 0 0        03/10/2022    3:50 PM 02/24/2022    2:56 PM 01/27/2022    2:43 PM 01/13/2022    9:32 AM  GAD 7 : Generalized Anxiety Score  Nervous, Anxious, on Edge 0 0 0 0  Control/stop worrying 0 0 0 0  Worry too much - different things 0 0 0 0  Trouble relaxing 0 0 0 0  Restless 0 0 0 0  Easily annoyed or irritable 0 0 0 0  Afraid - awful might happen 0 0 0 0  Total GAD 7 Score 0 0 0 0     Review of Systems:   Pertinent items are noted in HPI Denies any headaches, blurred vision, fatigue, shortness of breath, chest pain, abdominal pain, abnormal vaginal discharge/itching/odor/irritation, problems with periods, bowel movements,  urination, or intercourse unless otherwise stated above. Pertinent History Reviewed:  Reviewed past medical,surgical, social and family history.  Reviewed problem list, medications and allergies. Physical Assessment:  There were no vitals filed for this visit. There is no height or weight on file to calculate BMI.        Physical Examination:   General appearance - well appearing, and in no distress  Mental status - alert, oriented to person, place, and time  Psych:  She has a normal mood and affect  Skin - warm and dry, normal color, no suspicious lesions noted  Chest - effort normal, all lung fields clear to auscultation bilaterally  Heart - normal rate and regular rhythm  Neck:  midline trachea, no thyromegaly or nodules  Breasts - breasts appear normal, no suspicious masses, no skin or nipple changes or  axillary nodes  Abdomen - soft, nontender, nondistended, no masses or organomegaly  Pelvic - VULVA: normal appearing vulva with no masses, tenderness or lesions  VAGINA: normal appearing vagina with normal color and discharge, no lesions  CERVIX: normal appearing cervix without discharge or lesions, no CMT  Thin prep pap is {  Desc; done/not:10129} *** HR HPV cotesting  UTERUS: uterus is felt to be normal size, shape, consistency and nontender   ADNEXA: No adnexal masses or tenderness noted.  Rectal - normal rectal, good sphincter tone, no masses felt. Hemoccult: ***  Extremities:  No swelling or varicosities noted  Chaperone present for exam  No results found for this or any previous visit (from the past 24 hour(s)).  Assessment & Plan:      1. Encounter for annual routine gynecological examination ***  Will follow up results of pap smear and manage accordingly. Mammogram scheduled Colon cancer screening is up to date***Referral made to Gastroenterology for colonoscopy***discussed Cologuard vs Colonoscopy details and patient will decide and let us know her decision. Routine  preventative health maintenance measures emphasized. Please refer to After Visit Summary for other counseling recommendations.       Mammogram: @ 23yo, or sooner if problems Colonoscopy: @ 23yo, or sooner if problems  No orders of the defined types were placed in this encounter.   Meds: No orders of the defined types were placed in this encounter.   Follow-up: No follow-ups on file.  Bernerd Limbo, CNM 06/29/2022 8:15 PM

## 2022-06-30 ENCOUNTER — Ambulatory Visit: Payer: BC Managed Care – PPO | Admitting: Certified Nurse Midwife

## 2022-06-30 DIAGNOSIS — Z01419 Encounter for gynecological examination (general) (routine) without abnormal findings: Secondary | ICD-10-CM

## 2022-09-30 ENCOUNTER — Telehealth: Payer: BC Managed Care – PPO | Admitting: Family Medicine

## 2022-09-30 DIAGNOSIS — B9689 Other specified bacterial agents as the cause of diseases classified elsewhere: Secondary | ICD-10-CM

## 2022-09-30 DIAGNOSIS — B3731 Acute candidiasis of vulva and vagina: Secondary | ICD-10-CM

## 2022-09-30 DIAGNOSIS — N760 Acute vaginitis: Secondary | ICD-10-CM | POA: Diagnosis not present

## 2022-09-30 MED ORDER — FLUCONAZOLE 150 MG PO TABS: 150.0000 mg | ORAL_TABLET | Freq: Every day | ORAL | 0 refills | Status: DC

## 2022-09-30 MED ORDER — METRONIDAZOLE 500 MG PO TABS: 500.0000 mg | ORAL_TABLET | Freq: Two times a day (BID) | ORAL | 0 refills | Status: AC

## 2022-09-30 NOTE — Progress Notes (Signed)
E-Visit for Vaginal Symptoms  We are sorry that you are not feeling well. Here is how we plan to help! Based on what you shared with me it looks like you: May have a vaginosis due to bacteria and yeast  Vaginosis is an inflammation of the vagina that can result in discharge, itching and pain. The cause is usually a change in the normal balance of vaginal bacteria or an infection. Vaginosis can also result from reduced estrogen levels after menopause.  The most common causes of vaginosis are:   Bacterial vaginosis which results from an overgrowth of one on several organisms that are normally present in your vagina.   Yeast infections which are caused by a naturally occurring fungus called candida.   Vaginal atrophy (atrophic vaginosis) which results from the thinning of the vagina from reduced estrogen levels after menopause.   Trichomoniasis which is caused by a parasite and is commonly transmitted by sexual intercourse.  Factors that increase your risk of developing vaginosis include: Medications, such as antibiotics and steroids Uncontrolled diabetes Use of hygiene products such as bubble bath, vaginal spray or vaginal deodorant Douching Wearing damp or tight-fitting clothing Using an intrauterine device (IUD) for birth control Hormonal changes, such as those associated with pregnancy, birth control pills or menopause Sexual activity Having a sexually transmitted infection  Your treatment plan is Metronidazole or Flagyl '500mg'$  twice a day for 7 days.  I have electronically sent this prescription into the pharmacy that you have chosen. Along with Diflucan for yeast treatment  Be sure to take all of the medication as directed. Stop taking any medication if you develop a rash, tongue swelling or shortness of breath. Mothers who are breast feeding should consider pumping and discarding their breast milk while on these antibiotics. However, there is no consensus that infant exposure at  these doses would be harmful.  Remember that medication creams can weaken latex condoms. Marland Kitchen   HOME CARE:  Good hygiene may prevent some types of vaginosis from recurring and may relieve some symptoms:  Avoid baths, hot tubs and whirlpool spas. Rinse soap from your outer genital area after a shower, and dry the area well to prevent irritation. Don't use scented or harsh soaps, such as those with deodorant or antibacterial action. Avoid irritants. These include scented tampons and pads. Wipe from front to back after using the toilet. Doing so avoids spreading fecal bacteria to your vagina.  Other things that may help prevent vaginosis include:  Don't douche. Your vagina doesn't require cleansing other than normal bathing. Repetitive douching disrupts the normal organisms that reside in the vagina and can actually increase your risk of vaginal infection. Douching won't clear up a vaginal infection. Use a latex condom. Both female and female latex condoms may help you avoid infections spread by sexual contact. Wear cotton underwear. Also wear pantyhose with a cotton crotch. If you feel comfortable without it, skip wearing underwear to bed. Yeast thrives in [employer] Your symptoms should improve in the next day or two.  GET HELP RIGHT AWAY IF:  You have pain in your lower abdomen ( pelvic area or over your ovaries) You develop nausea or vomiting You develop a fever Your discharge changes or worsens You have persistent pain with intercourse You develop shortness of breath, a rapid pulse, or you faint.  These symptoms could be signs of problems or infections that need to be evaluated by a medical provider now.  MAKE SURE YOU   Understand these instructions.  Will watch your condition. Will get help right away if you are not doing well or get worse.  Thank you for choosing an e-visit.  Your e-visit answers were reviewed by a board certified advanced clinical practitioner to  complete your personal care plan. Depending upon the condition, your plan could have included both over the counter or prescription medications.  Please review your pharmacy choice. Make sure the pharmacy is open so you can pick up prescription now. If there is a problem, you may contact your provider through [employer] and have the prescription routed to another pharmacy.  Your safety is important to [place]. If you have drug allergies check your prescription carefully.   For the next 24 hours you can use MyChart to ask questions about today's visit, request a non-urgent call back, or ask for a work or school excuse. You will get an email in the next two days asking about your experience. I hope that your e-visit has been valuable and will speed your recovery.   I provided 5 minutes of non face-to-face time during this encounter for chart review, medication and order placement, as well as and documentation.

## 2023-02-19 ENCOUNTER — Telehealth: Payer: BC Managed Care – PPO | Admitting: Nurse Practitioner

## 2023-02-19 DIAGNOSIS — B9689 Other specified bacterial agents as the cause of diseases classified elsewhere: Secondary | ICD-10-CM

## 2023-02-19 DIAGNOSIS — N760 Acute vaginitis: Secondary | ICD-10-CM | POA: Diagnosis not present

## 2023-02-19 MED ORDER — FLUCONAZOLE 150 MG PO TABS: 150.0000 mg | ORAL_TABLET | Freq: Once | ORAL | 0 refills | Status: AC

## 2023-02-19 MED ORDER — METRONIDAZOLE 500 MG PO TABS: 500.0000 mg | ORAL_TABLET | Freq: Two times a day (BID) | ORAL | 0 refills | Status: DC

## 2023-02-19 NOTE — Progress Notes (Signed)
E-Visit for Vaginal Symptoms  We are sorry that you are not feeling well. Here is how we plan to help! Based on what you shared with me it looks like you: May have a vaginosis due to bacteria  Vaginosis is an inflammation of the vagina that can result in discharge, itching and pain. The cause is usually a change in the normal balance of vaginal bacteria or an infection. Vaginosis can also result from reduced estrogen levels after menopause.  The most common causes of vaginosis are:   Bacterial vaginosis which results from an overgrowth of one on several organisms that are normally present in your vagina.   Yeast infections which are caused by a naturally occurring fungus called candida.   Vaginal atrophy (atrophic vaginosis) which results from the thinning of the vagina from reduced estrogen levels after menopause.   Trichomoniasis which is caused by a parasite and is commonly transmitted by sexual intercourse.  Factors that increase your risk of developing vaginosis include: Medications, such as antibiotics and steroids Uncontrolled diabetes Use of hygiene products such as bubble bath, vaginal spray or vaginal deodorant Douching Wearing damp or tight-fitting clothing Using an intrauterine device (IUD) for birth control Hormonal changes, such as those associated with pregnancy, birth control pills or menopause Sexual activity Having a sexually transmitted infection  Your treatment plan is Metronidazole or Flagyl 500mg twice a day for 7 days.  I have electronically sent this prescription into the pharmacy that you have chosen.  Be sure to take all of the medication as directed. Stop taking any medication if you develop a rash, tongue swelling or shortness of breath. Mothers who are breast feeding should consider pumping and discarding their breast milk while on these antibiotics. However, there is no consensus that infant exposure at these doses would be harmful.  Remember that  medication creams can weaken latex condoms. .   HOME CARE:  Good hygiene may prevent some types of vaginosis from recurring and may relieve some symptoms:  Avoid baths, hot tubs and whirlpool spas. Rinse soap from your outer genital area after a shower, and dry the area well to prevent irritation. Don't use scented or harsh soaps, such as those with deodorant or antibacterial action. Avoid irritants. These include scented tampons and pads. Wipe from front to back after using the toilet. Doing so avoids spreading fecal bacteria to your vagina.  Other things that may help prevent vaginosis include:  Don't douche. Your vagina doesn't require cleansing other than normal bathing. Repetitive douching disrupts the normal organisms that reside in the vagina and can actually increase your risk of vaginal infection. Douching won't clear up a vaginal infection. Use a latex condom. Both female and female latex condoms may help you avoid infections spread by sexual contact. Wear cotton underwear. Also wear pantyhose with a cotton crotch. If you feel comfortable without it, skip wearing underwear to bed. Yeast thrives in moist environments Your symptoms should improve in the next day or two.  GET HELP RIGHT AWAY IF:  You have pain in your lower abdomen ( pelvic area or over your ovaries) You develop nausea or vomiting You develop a fever Your discharge changes or worsens You have persistent pain with intercourse You develop shortness of breath, a rapid pulse, or you faint.  These symptoms could be signs of problems or infections that need to be evaluated by a medical provider now.  MAKE SURE YOU   Understand these instructions. Will watch your condition. Will get help right   away if you are not doing well or get worse.  Thank you for choosing an e-visit.  Your e-visit answers were reviewed by a board certified advanced clinical practitioner to complete your personal care plan. Depending upon the  condition, your plan could have included both over the counter or prescription medications.  Please review your pharmacy choice. Make sure the pharmacy is open so you can pick up prescription now. If there is a problem, you may contact your provider through MyChart messaging and have the prescription routed to another pharmacy.  Your safety is important to us. If you have drug allergies check your prescription carefully.   For the next 24 hours you can use MyChart to ask questions about today's visit, request a non-urgent call back, or ask for a work or school excuse. You will get an email in the next two days asking about your experience. I hope that your e-visit has been valuable and will speed your recovery.  Mary-Margaret Martin, FNP   5-10 minutes spent reviewing and documenting in chart.  

## 2023-02-19 NOTE — Addendum Note (Signed)
Addended by: Bennie Pierini on: 02/19/2023 10:15 AM   Modules accepted: Orders, Level of Service

## 2023-02-19 NOTE — Progress Notes (Signed)

## 2023-04-12 ENCOUNTER — Telehealth: Payer: BC Managed Care – PPO | Admitting: Physician Assistant

## 2023-04-12 DIAGNOSIS — U071 COVID-19: Secondary | ICD-10-CM

## 2023-04-13 MED ORDER — PROMETHAZINE-DM 6.25-15 MG/5ML PO SYRP: 5.0000 mL | ORAL_SOLUTION | Freq: Four times a day (QID) | ORAL | 0 refills | Status: DC | PRN

## 2023-04-13 MED ORDER — BENZONATATE 100 MG PO CAPS: 100.0000 mg | ORAL_CAPSULE | Freq: Three times a day (TID) | ORAL | 0 refills | Status: DC | PRN

## 2023-04-13 NOTE — Progress Notes (Signed)
E-Visit  for Positive Covid Test Result   We are sorry you are not feeling well. We are here to help!  You have tested positive for COVID-19, meaning that you were infected with the novel coronavirus and could give the virus to others.  Most people with COVID-19 have mild illness and can recover at home without medical care. Do not leave your home, except to get medical care. Do not visit public areas and do not go to places where you are unable to wear a mask. It is important that you stay home  to take care for yourself and to help protect other people in your home and community.      Isolation Instructions:   You are to isolate at home until you have been fever free for at least 24 hours without a fever-reducing medication, and symptoms have been steadily improving for 24 hours. At that time,  you can end isolation but need to mask for an additional 5 days.  If you must be around other household members who do not have symptoms, you need to make sure that both you and the family members are masking consistently with a high-quality mask.  If you note any worsening of symptoms despite treatment, please seek an in-person evaluation ASAP. If you note any significant shortness of breath or any chest pain, please seek ER evaluation. Please do not delay care!   Go to the nearest hospital ED for assessment if fever/cough/breathlessness are severe or illness seems like a threat to life.    The following symptoms may appear 2-14 days after exposure: Fever Cough Shortness of breath or difficulty breathing Chills Repeated shaking with chills Muscle pain Headache Sore throat New loss of taste or smell Fatigue Congestion or runny nose Nausea or vomiting Diarrhea  You can use medication such as prescription cough medication called Tessalon Perles 100 mg. You may take 1-2 capsules every 8 hours as needed for cough and prescription cough medication called Phenergan DM 6.25 mg/15 mg. You make  take one teaspoon / 5 ml every 4-6 hours as needed for cough. These have been sent to your pharmacy.  You may also take acetaminophen (Tylenol) as needed for fever.  HOME CARE: Only take medications as instructed by your medical team. Drink plenty of fluids and get plenty of rest. A steam or ultrasonic humidifier can help if you have congestion.   GET HELP RIGHT AWAY IF YOU HAVE EMERGENCY WARNING SIGNS.  Call 911 or proceed to your closest emergency facility if: You develop worsening high fever. Trouble breathing Bluish lips or face Persistent pain or pressure in the chest New confusion Inability to wake or stay awake You cough up blood. Your symptoms become more severe Inability to hold down food or fluids  This list is not all possible symptoms. Contact your medical provider for any symptoms that are severe or concerning to you.   Your e-visit answers were reviewed by a board certified advanced clinical practitioner to complete your personal care plan.  Depending on the condition, your plan could have included both over the counter or prescription medications.  If there is a problem please reply once you have received a response from your provider.  Your safety is important to [place].  If you have drug allergies check your prescription carefully.    You can use MyChart to ask questions about today's visit, request a non-urgent call back, or ask for a work or school excuse for 24 hours related to this  e-Visit. If it has been greater than 24 hours you will need to follow up with your provider, or enter a new e-Visit to address those concerns. You will get an e-mail in the next two days asking about your experience.  I hope that your e-visit has been valuable and will speed your recovery. Thank you for using e-visits.

## 2023-04-13 NOTE — Progress Notes (Signed)
I have spent 5 minutes in review of e-visit questionnaire, review and updating patient chart, medical decision making and response to patient.   William Cody Martin, PA-C    

## 2023-04-29 ENCOUNTER — Ambulatory Visit
Admission: EM | Admit: 2023-04-29 | Discharge: 2023-04-29 | Disposition: A | Discharge status: Home / Self Care | Payer: BC Managed Care – PPO | Attending: Physician Assistant | Admitting: Physician Assistant

## 2023-04-29 DIAGNOSIS — Z113 Encounter for screening for infections with a predominantly sexual mode of transmission: Secondary | ICD-10-CM | POA: Insufficient documentation

## 2023-04-29 DIAGNOSIS — N898 Other specified noninflammatory disorders of vagina: Secondary | ICD-10-CM | POA: Insufficient documentation

## 2023-04-29 DIAGNOSIS — R350 Frequency of micturition: Secondary | ICD-10-CM | POA: Diagnosis present

## 2023-04-29 LAB — POCT URINALYSIS DIP (MANUAL ENTRY)
Bilirubin, UA: NEGATIVE
Blood, UA: NEGATIVE
Glucose, UA: NEGATIVE mg/dL
Ketones, POC UA: NEGATIVE mg/dL
Nitrite, UA: NEGATIVE
Protein Ur, POC: NEGATIVE mg/dL
Spec Grav, UA: 1.02 (ref 1.010–1.025)
Urobilinogen, UA: 1 U/dL
pH, UA: 7 (ref 5.0–8.0)

## 2023-04-29 LAB — POCT URINE PREGNANCY: Preg Test, Ur: NEGATIVE

## 2023-04-29 NOTE — ED Triage Notes (Addendum)
"  I think I have either a UTI or BV". "I am having white discharge and inflammation down there". No dysuria. "I am peeing more often". I would like STI testing as well. No blood work for STI Testing needed. "Very itchy".

## 2023-04-29 NOTE — ED Provider Notes (Signed)
[name]    CSN: [id number] Arrival date & time: 04/29/23  1709      History   Chief Complaint Chief Complaint  Patient presents with   UTI Symptoms   Vaginal Discharge    HPI Wendy Aguilar is a 22 y.o. female.   Patient here today for evaluation of urinary frequency and vaginal discharge.  She would like STD screening.  She denies any dysuria.  She has not had fever.  The history is provided by the patient.  Vaginal Discharge Associated symptoms: no abdominal pain, no dysuria, no fever, no nausea and no vomiting     Past Medical History:  Diagnosis Date   Medical history non-contributory     Patient Active Problem List   Diagnosis Date Noted   History of cesarean section x 2 10/21/2017    Past Surgical History:  Procedure Laterality Date   CESAREAN SECTION N/A 10/10/2016   Procedure: CESAREAN SECTION;  Surgeon: Adam Phenix, MD;  Location: [facility] BIRTHING SUITES;  Service: Obstetrics;  Laterality: N/A;   CESAREAN SECTION N/A 03/19/2018   Procedure: CESAREAN SECTION;  Surgeon: Conan Bowens, MD;  Location: [facility] BIRTHING SUITES;  Service: Obstetrics;  Laterality: N/A;   CESAREAN SECTION N/A 03/19/2022   Procedure: CESAREAN SECTION;  Surgeon: Wheaton Bing, MD;  Location: [facility] LD ORS;  Service: Obstetrics;  Laterality: N/A;    OB History     Gravida  3   Para  3   Term  3   Preterm  0   AB  0   Living  3      SAB  0   IAB  0   Ectopic  0   Multiple  0   Live Births  3            Home Medications    Prior to Admission medications   Medication Sig Start Date End Date Taking? Authorizing Provider  Ferrous Sulfate (IRON PO) Take by mouth. 07/29/21  Yes [provider]  acetaminophen (TYLENOL) 500 MG tablet Take 2 tablets (1,000 mg total) by mouth every 8 (eight) hours. Patient not taking: Reported on 04/28/2022 03/22/22   Mercado-Ortiz, Lahoma Crocker, DO  benzonatate (TESSALON) 100 MG capsule Take 1 capsule (100 mg  total) by mouth 3 (three) times daily as needed for cough. 04/13/23   Waldon Merl, PA-C  doxycycline (VIBRAMYCIN) 100 MG capsule Take 1 capsule (100 mg total) by mouth 2 (two) times daily for 7 days. 05/02/23 05/09/23  Merrilee Jansky, MD  Drospirenone (SLYND) 4 MG TABS Take 1 tablet by mouth daily. Patient not taking: Reported on 06/20/2022 03/22/22 04/21/22  Mercado-Ortiz, Lahoma Crocker, DO  ibuprofen (ADVIL) 600 MG tablet Take 1 tablet (600 mg total) by mouth every 6 (six) hours. 03/22/22   Mercado-Ortiz, Lahoma Crocker, DO  metroNIDAZOLE (FLAGYL) 500 MG tablet Take 1 tablet (500 mg total) by mouth 2 (two) times daily. 05/02/23   Merrilee Jansky, MD  promethazine-dextromethorphan (PROMETHAZINE-DM) 6.25-15 MG/5ML syrup Take 5 mLs by mouth 4 (four) times daily as needed for cough. 04/13/23   Waldon Merl, PA-C  senna-docusate (SENOKOT-S) 8.6-50 MG tablet Take 2 tablets by mouth at bedtime as needed for mild constipation. Patient not taking: Reported on 04/28/2022 03/22/22   Myrtie Hawk, DO    Family History Family History  Problem Relation Age of Onset   Hypertension Mother    Alcohol abuse Neg Hx    Arthritis Neg Hx  Asthma Neg Hx    Birth defects Neg Hx    COPD Neg Hx    Depression Neg Hx    Diabetes Neg Hx    Drug abuse Neg Hx    Early death Neg Hx    Hearing loss Neg Hx    Heart disease Neg Hx    Hyperlipidemia Neg Hx    Kidney disease Neg Hx    Learning disabilities Neg Hx    Mental illness Neg Hx    Mental retardation Neg Hx    Miscarriages / Stillbirths Neg Hx    Stroke Neg Hx    Vision loss Neg Hx    Varicose Veins Neg Hx     Social History Social History   Tobacco Use   Smoking status: Never   Smokeless tobacco: Never  Vaping Use   Vaping status: Never Used  Substance Use Topics   Alcohol use: Not Currently   Drug use: No     Allergies   Patient has no known allergies.   Review of Systems Review of Systems  Constitutional:  Negative for  chills and fever.  Eyes:  Negative for discharge and redness.  Respiratory:  Negative for shortness of breath.   Gastrointestinal:  Negative for abdominal pain, nausea and vomiting.  Genitourinary:  Positive for frequency and vaginal discharge. Negative for dysuria.     Physical Exam Triage Vital Signs ED Triage Vitals  Encounter Vitals Group     BP 04/29/23 1718 119/70     Systolic BP Percentile --      Diastolic BP Percentile --      Pulse Rate 04/29/23 1718 66     Resp 04/29/23 1718 18     Temp 04/29/23 1718 98 F (36.7 C)     Temp Source 04/29/23 1718 Oral     SpO2 04/29/23 1718 99 %     Weight 04/29/23 1716 170 lb (77.1 kg)     Height 04/29/23 1716 5\' 9"  (1.753 m)     Head Circumference --      Peak Flow --      Pain Score 04/29/23 1713 0     Pain Loc --      Pain Education --      Exclude from Growth Chart --    No data found.  Updated Vital Signs BP 119/70 (BP Location: Right Arm)   Pulse 66   Temp 98 F (36.7 C) (Oral)   Resp 18   Ht 5\' 9"  (1.753 m)   Wt 170 lb (77.1 kg)   LMP 03/25/2023 (Exact Date)   SpO2 99%   BMI 25.10 kg/m   Visual Acuity Right Eye Distance:   Left Eye Distance:   Bilateral Distance:    Right Eye Near:   Left Eye Near:    Bilateral Near:     Physical Exam Vitals and nursing note reviewed.  Constitutional:      General: She is not in acute distress.    Appearance: Normal appearance. She is not ill-appearing.  HENT:     Head: Normocephalic and atraumatic.  Eyes:     Conjunctiva/sclera: Conjunctivae normal.  Cardiovascular:     Rate and Rhythm: Normal rate.  Pulmonary:     Effort: Pulmonary effort is normal. No respiratory distress.  Neurological:     Mental Status: She is alert.  Psychiatric:        Mood and Affect: Mood normal.        Behavior: Behavior normal.  Thought Content: Thought content normal.      UC Treatments / Results  Labs (all labs ordered are listed, but only abnormal results are  displayed) Labs Reviewed  POCT URINALYSIS DIP (MANUAL ENTRY) - Abnormal; Notable for the following components:      Result Value   Leukocytes, UA Trace (*)    All other components within normal limits  CERVICOVAGINAL ANCILLARY ONLY - Abnormal; Notable for the following components:   Chlamydia Positive (*)    Bacterial Vaginitis (gardnerella) Positive (*)    Candida Vaginitis Positive (*)    All other components within normal limits  URINE CULTURE  POCT URINE PREGNANCY    EKG   Radiology No results found.  Procedures Procedures (including critical care time)  Medications Ordered in UC Medications - No data to display  Initial Impression / Assessment and Plan / UC Course  I have reviewed the triage vital signs and the nursing notes.  Pertinent labs & imaging results that were available during my care of the patient were reviewed by me and considered in my medical decision making (see chart for details).    Low suspicion for UTI, will order STD screening as requested.  Will await results further recommendation.  Final Clinical Impressions(s) / UC Diagnoses   Final diagnoses:  Urinary frequency   Discharge Instructions   None    ED Prescriptions   None    PDMP not reviewed this encounter.   Tomi Bamberger, PA-C 05/03/23 [phone]

## 2023-05-01 LAB — URINE CULTURE: Culture: NO GROWTH

## 2023-05-02 ENCOUNTER — Telehealth: Payer: Self-pay | Admitting: Emergency Medicine

## 2023-05-02 LAB — CERVICOVAGINAL ANCILLARY ONLY
Bacterial Vaginitis (gardnerella): POSITIVE — AB
Candida Glabrata: NEGATIVE
Candida Vaginitis: POSITIVE — AB
Chlamydia: POSITIVE — AB
Comment: NEGATIVE
Comment: NEGATIVE
Comment: NEGATIVE
Comment: NEGATIVE
Comment: NEGATIVE
Comment: NORMAL
Neisseria Gonorrhea: NEGATIVE
Trichomonas: NEGATIVE

## 2023-05-02 MED ORDER — DOXYCYCLINE HYCLATE 100 MG PO CAPS: 100.0000 mg | ORAL_CAPSULE | Freq: Two times a day (BID) | ORAL | 0 refills | Status: AC

## 2023-05-02 MED ORDER — FLUCONAZOLE 150 MG PO TABS: 150.0000 mg | ORAL_TABLET | Freq: Once | ORAL | 0 refills | Status: AC

## 2023-05-02 MED ORDER — METRONIDAZOLE 500 MG PO TABS: 500.0000 mg | ORAL_TABLET | Freq: Two times a day (BID) | ORAL | 0 refills | Status: DC

## 2023-05-02 NOTE — Telephone Encounter (Signed)
Per protocol, patient will need treatment with Doxycycline, Metronidazole and Diflucan. Results seen on mychart Attempted to reach patient x 1, VM is full Awaiting return call from patient to confirm if she is currently breastfeeding, no prescriptions sent yet (saved in telephone note) If she is NOT breastfeeding, okay to send.   [facility] notified

## 2023-05-02 NOTE — Telephone Encounter (Signed)
Patient returned call.  Confirmed she is NOT breastfeeding Contacted patient by phone.  Verified identity using two identifiers.  Provided positive result.  Reviewed safe sex practices, notifying partners, and refraining from sexual activities for 7 days from time of treatment.  Patient verified understanding, all questions answered.

## 2023-05-03 ENCOUNTER — Encounter: Payer: Self-pay | Admitting: Physician Assistant

## 2024-03-04 ENCOUNTER — Ambulatory Visit: Admission: EM | Admit: 2024-03-04 | Discharge: 2024-03-04 | Disposition: A | Discharge status: Home / Self Care

## 2024-03-04 ENCOUNTER — Encounter: Payer: Self-pay | Admitting: Emergency Medicine

## 2024-03-04 DIAGNOSIS — H9201 Otalgia, right ear: Secondary | ICD-10-CM | POA: Diagnosis not present

## 2024-03-04 NOTE — ED Triage Notes (Signed)
 Pt st' she has had some sinus pain but is getting better but st's she woke up with am with a sever right earache

## 2024-03-07 ENCOUNTER — Encounter: Payer: Self-pay | Admitting: Physician Assistant

## 2024-03-07 NOTE — ED Provider Notes (Signed)
 [name]    CSN: [id number] Arrival date & time: 03/04/24  1451      History   Chief Complaint Chief Complaint  Patient presents with   Otalgia    HPI Wendy Aguilar is a 23 y.o. female.   Patient here today for evaluation of some sinus pain that is getting better.  She woke up and had some right ear pain.  She denies any fever.  She does not report other symptoms.  The history is provided by the patient.  Otalgia Associated symptoms: congestion   Associated symptoms: no abdominal pain, no cough, no diarrhea, no fever, no sore throat and no vomiting     Past Medical History:  Diagnosis Date   Medical history non-contributory     Patient Active Problem List   Diagnosis Date Noted   History of cesarean section x 2 10/21/2017    Past Surgical History:  Procedure Laterality Date   CESAREAN SECTION N/A 10/10/2016   Procedure: CESAREAN SECTION;  Surgeon: Lynwood KANDICE Solomons, MD;  Location: [facility] BIRTHING SUITES;  Service: Obstetrics;  Laterality: N/A;   CESAREAN SECTION N/A 03/19/2018   Procedure: CESAREAN SECTION;  Surgeon: Nicholaus Burnard HERO, MD;  Location: [facility] BIRTHING SUITES;  Service: Obstetrics;  Laterality: N/A;   CESAREAN SECTION N/A 03/19/2022   Procedure: CESAREAN SECTION;  Surgeon: Izell Harari, MD;  Location: [facility] LD ORS;  Service: Obstetrics;  Laterality: N/A;    OB History     Gravida  3   Para  3   Term  3   Preterm  0   AB  0   Living  3      SAB  0   IAB  0   Ectopic  0   Multiple  0   Live Births  3            Home Medications    Prior to Admission medications   Medication Sig Start Date End Date Taking? Authorizing Provider  acetaminophen  (TYLENOL ) 500 MG tablet Take 2 tablets (1,000 mg total) by mouth every 8 (eight) hours. Patient not taking: Reported on 04/28/2022 03/22/22   Richie Harlene RODES, DO  benzonatate  (TESSALON ) 100 MG capsule Take 1 capsule (100 mg total) by mouth 3 (three) times daily as needed for  cough. 04/13/23   Gladis Elsie BROCKS, PA-C  Drospirenone  (SLYND ) 4 MG TABS Take 1 tablet by mouth daily. Patient not taking: Reported on 06/20/2022 03/22/22 04/21/22  Richie Harlene RODES, DO  Ferrous Sulfate (IRON  PO) Take by mouth. 07/29/21   [provider]  ibuprofen  (ADVIL ) 600 MG tablet Take 1 tablet (600 mg total) by mouth every 6 (six) hours. 03/22/22   Mercado-Ortiz, Harlene RODES, DO  metroNIDAZOLE  (FLAGYL ) 500 MG tablet Take 1 tablet (500 mg total) by mouth 2 (two) times daily. 05/02/23   LampteyAleene KIDD, MD  promethazine -dextromethorphan (PROMETHAZINE -DM) 6.25-15 MG/5ML syrup Take 5 mLs by mouth 4 (four) times daily as needed for cough. 04/13/23   Gladis Elsie BROCKS, PA-C  senna-docusate (SENOKOT-S) 8.6-50 MG tablet Take 2 tablets by mouth at bedtime as needed for mild constipation. Patient not taking: Reported on 04/28/2022 03/22/22   Richie Harlene RODES, DO    Family History Family History  Problem Relation Age of Onset   Hypertension Mother    Alcohol abuse Neg Hx    Arthritis Neg Hx    Asthma Neg Hx    Birth defects Neg Hx    COPD Neg Hx  Depression Neg Hx    Diabetes Neg Hx    Drug abuse Neg Hx    Early death Neg Hx    Hearing loss Neg Hx    Heart disease Neg Hx    Hyperlipidemia Neg Hx    Kidney disease Neg Hx    Learning disabilities Neg Hx    Mental illness Neg Hx    Mental retardation Neg Hx    Miscarriages / Stillbirths Neg Hx    Stroke Neg Hx    Vision loss Neg Hx    Varicose Veins Neg Hx     Social History Social History   Tobacco Use   Smoking status: Never   Smokeless tobacco: Never  Vaping Use   Vaping status: Never Used  Substance Use Topics   Alcohol use: Not Currently   Drug use: No     Allergies   Patient has no known allergies.   Review of Systems Review of Systems  Constitutional:  Negative for chills and fever.  HENT:  Positive for congestion and ear pain. Negative for sore throat.   Eyes:  Negative for discharge and  redness.  Respiratory:  Negative for cough, shortness of breath and wheezing.   Gastrointestinal:  Negative for abdominal pain, diarrhea, nausea and vomiting.     Physical Exam Triage Vital Signs ED Triage Vitals  Encounter Vitals Group     BP 03/04/24 1459 118/78     Girls Systolic BP Percentile --      Girls Diastolic BP Percentile --      Boys Systolic BP Percentile --      Boys Diastolic BP Percentile --      Pulse Rate 03/04/24 1459 83     Resp 03/04/24 1459 16     Temp 03/04/24 1459 98.3 F (36.8 C)     Temp Source 03/04/24 1459 Oral     SpO2 03/04/24 1459 96 %     Weight --      Height --      Head Circumference --      Peak Flow --      Pain Score 03/04/24 1501 8     Pain Loc --      Pain Education --      Exclude from Growth Chart --    No data found.  Updated Vital Signs BP 118/78 (BP Location: Left Arm)   Pulse 83   Temp 98.3 F (36.8 C) (Oral)   Resp 16   LMP 03/04/2024 (Exact Date)   SpO2 96%   Visual Acuity Right Eye Distance:   Left Eye Distance:   Bilateral Distance:    Right Eye Near:   Left Eye Near:    Bilateral Near:     Physical Exam Vitals and nursing note reviewed.  Constitutional:      General: She is not in acute distress.    Appearance: Normal appearance. She is not ill-appearing.  HENT:     Head: Normocephalic and atraumatic.     Right Ear: Tympanic membrane normal.     Left Ear: Tympanic membrane normal.     Nose: No congestion or rhinorrhea.     Mouth/Throat:     Mouth: Mucous membranes are moist.     Pharynx: No oropharyngeal exudate or posterior oropharyngeal erythema.  Eyes:     Conjunctiva/sclera: Conjunctivae normal.  Cardiovascular:     Rate and Rhythm: Normal rate and regular rhythm.     Heart sounds: Normal heart sounds. No murmur heard.  Pulmonary:     Effort: Pulmonary effort is normal. No respiratory distress.     Breath sounds: Normal breath sounds. No wheezing, rhonchi or rales.  Skin:    General: Skin is  warm and dry.  Neurological:     Mental Status: She is alert.  Psychiatric:        Mood and Affect: Mood normal.        Thought Content: Thought content normal.      UC Treatments / Results  Labs (all labs ordered are listed, but only abnormal results are displayed) Labs Reviewed - No data to display  EKG   Radiology No results found.  Procedures Procedures (including critical care time)  Medications Ordered in UC Medications - No data to display  Initial Impression / Assessment and Plan / UC Course  I have reviewed the triage vital signs and the nursing notes.  Pertinent labs & imaging results that were available during my care of the patient were reviewed by me and considered in my medical decision making (see chart for details).     Reassured patient ear appears normal on exam.  Advised symptomatic treatment and follow-up if no gradual improvement with any further concerns.   Final Clinical Impressions(s) / UC Diagnoses   Final diagnoses:  Acute otalgia, right   Discharge Instructions   None    ED Prescriptions   None    PDMP not reviewed this encounter.   Billy Asberry FALCON, PA-C 03/07/24 1659

## 2024-05-01 ENCOUNTER — Ambulatory Visit
Admission: EM | Admit: 2024-05-01 | Discharge: 2024-05-01 | Disposition: A | Discharge status: Home / Self Care | Attending: Emergency Medicine | Admitting: Emergency Medicine

## 2024-05-01 ENCOUNTER — Encounter: Payer: Self-pay | Admitting: Emergency Medicine

## 2024-05-01 DIAGNOSIS — N898 Other specified noninflammatory disorders of vagina: Secondary | ICD-10-CM

## 2024-05-01 DIAGNOSIS — R350 Frequency of micturition: Secondary | ICD-10-CM | POA: Diagnosis present

## 2024-05-01 DIAGNOSIS — Z113 Encounter for screening for infections with a predominantly sexual mode of transmission: Secondary | ICD-10-CM | POA: Diagnosis not present

## 2024-05-01 LAB — POCT URINE DIPSTICK
Bilirubin, UA: NEGATIVE
Blood, UA: NEGATIVE
Glucose, UA: NEGATIVE mg/dL
Ketones, POC UA: NEGATIVE mg/dL
Leukocytes, UA: NEGATIVE
Nitrite, UA: NEGATIVE
Protein Ur, POC: NEGATIVE mg/dL
Spec Grav, UA: 1.01 (ref 1.010–1.025)
Urobilinogen, UA: 0.2 U/dL
pH, UA: 6 (ref 5.0–8.0)

## 2024-05-01 LAB — POCT URINE PREGNANCY: Preg Test, Ur: NEGATIVE

## 2024-05-01 MED ORDER — METRONIDAZOLE 500 MG PO TABS: 500.0000 mg | ORAL_TABLET | Freq: Two times a day (BID) | ORAL | 0 refills | Status: AC

## 2024-05-01 NOTE — ED Provider Notes (Signed)
 [name]    CSN: [id number] Arrival date & time: 05/01/24  1200      History   Chief Complaint Chief Complaint  Patient presents with   Urinary Frequency   Vaginal Discharge    HPI [name] is a 23 y.o. female.  3 day history of urinary frequency and white vaginal discharge She reports history of BV that presented the same way  Not having dysuria, hematuria, abd or flank pain, fever  LMP 9/13  Would like STD testing   Past Medical History:  Diagnosis Date   Medical history non-contributory     Patient Active Problem List   Diagnosis Date Noted   History of cesarean section x 2 10/21/2017    Past Surgical History:  Procedure Laterality Date   CESAREAN SECTION N/A 10/10/2016   Procedure: CESAREAN SECTION;  Surgeon: Lynwood KANDICE Solomons, MD;  Location: [facility] BIRTHING SUITES;  Service: Obstetrics;  Laterality: N/A;   CESAREAN SECTION N/A 03/19/2018   Procedure: CESAREAN SECTION;  Surgeon: Nicholaus Burnard HERO, MD;  Location: [facility] BIRTHING SUITES;  Service: Obstetrics;  Laterality: N/A;   CESAREAN SECTION N/A 03/19/2022   Procedure: CESAREAN SECTION;  Surgeon: Izell Harari, MD;  Location: [facility] LD ORS;  Service: Obstetrics;  Laterality: N/A;    OB History     Gravida  3   Para  3   Term  3   Preterm  0   AB  0   Living  3      SAB  0   IAB  0   Ectopic  0   Multiple  0   Live Births  3            Home Medications    Prior to Admission medications   Medication Sig Start Date End Date Taking? Authorizing Provider  metroNIDAZOLE  (FLAGYL ) 500 MG tablet Take 1 tablet (500 mg total) by mouth 2 (two) times daily for 7 days. 05/01/24 05/08/24 Yes Eather Chaires, Asberry, PA-C    Family History Family History  Problem Relation Age of Onset   Hypertension Mother    Alcohol abuse Neg Hx    Arthritis Neg Hx    Asthma Neg Hx    Birth defects Neg Hx    COPD Neg Hx    Depression Neg Hx    Diabetes Neg Hx    Drug abuse Neg Hx    Early death  Neg Hx    Hearing loss Neg Hx    Heart disease Neg Hx    Hyperlipidemia Neg Hx    Kidney disease Neg Hx    Learning disabilities Neg Hx    Mental illness Neg Hx    Mental retardation Neg Hx    Miscarriages / Stillbirths Neg Hx    Stroke Neg Hx    Vision loss Neg Hx    Varicose Veins Neg Hx     Social History Social History   Tobacco Use   Smoking status: Never    Passive exposure: Never   Smokeless tobacco: Never  Vaping Use   Vaping status: Never Used  Substance Use Topics   Alcohol use: Not Currently   Drug use: No     Allergies   Patient has no known allergies.   Review of Systems Review of Systems  Genitourinary:  Positive for frequency and vaginal discharge.   As per HPI  Physical Exam Triage Vital Signs ED Triage Vitals  Encounter Vitals Group     BP 05/01/24 1222  128/80     Girls Systolic BP Percentile --      Girls Diastolic BP Percentile --      Boys Systolic BP Percentile --      Boys Diastolic BP Percentile --      Pulse Rate 05/01/24 1222 82     Resp 05/01/24 1222 16     Temp 05/01/24 1222 98 F (36.7 C)     Temp Source 05/01/24 1222 Oral     SpO2 05/01/24 1222 98 %     Weight 05/01/24 1221 190 lb (86.2 kg)     Height --      Head Circumference --      Peak Flow --      Pain Score 05/01/24 1221 0     Pain Loc --      Pain Education --      Exclude from Growth Chart --    No data found.  Updated Vital Signs BP 128/80 (BP Location: Left Arm)   Pulse 82   Temp 98 F (36.7 C) (Oral)   Resp 16   Wt 190 lb (86.2 kg)   LMP 04/15/2024 (Approximate)   SpO2 98%   Breastfeeding No   BMI 28.06 kg/m   Visual Acuity Right Eye Distance:   Left Eye Distance:   Bilateral Distance:    Right Eye Near:   Left Eye Near:    Bilateral Near:     Physical Exam Vitals and nursing note reviewed.  Constitutional:      General: She is not in acute distress. HENT:     Mouth/Throat:     Mouth: Mucous membranes are moist.     Pharynx:  Oropharynx is clear.  Eyes:     Conjunctiva/sclera: Conjunctivae normal.     Pupils: Pupils are equal, round, and reactive to light.  Cardiovascular:     Rate and Rhythm: Normal rate and regular rhythm.     Heart sounds: Normal heart sounds.  Pulmonary:     Effort: Pulmonary effort is normal.     Breath sounds: Normal breath sounds.  Abdominal:     Palpations: Abdomen is soft.     Tenderness: There is no abdominal tenderness. There is no right CVA tenderness, left CVA tenderness or guarding.  Neurological:     Mental Status: She is alert and oriented to person, place, and time.      UC Treatments / Results  Labs (all labs ordered are listed, but only abnormal results are displayed) Labs Reviewed  POCT URINE PREGNANCY - Normal  POCT URINE DIPSTICK - Normal  CERVICOVAGINAL ANCILLARY ONLY    EKG   Radiology No results found.  Procedures Procedures (including critical care time)  Medications Ordered in UC Medications - No data to display  Initial Impression / Assessment and Plan / UC Course  I have reviewed the triage vital signs and the nursing notes.  Pertinent labs & imaging results that were available during my care of the patient were reviewed by me and considered in my medical decision making (see chart for details).  UPT negative  UA negative, no signs of infection  Cytology swab is pending. Treat for BV per patient request with flagyl  BID x 7 days. Advised will contact if other treatment is needed.  Increase water  intake Monitor symptoms Return precautions  Final Clinical Impressions(s) / UC Diagnoses   Final diagnoses:  Urinary frequency  Vaginal discharge  Screen for STD (sexually transmitted disease)     Discharge Instructions  We will call you if anything on your swab returns positive. You can also see these results on MyChart. Please abstain from sexual intercourse until your results return.  In the meantime I am treating your for  BV Take the Flagyl  twice daily for 7 days Take with food to avoid upset stomach. We will call you if anything needs to be adjusted based on your swab results      ED Prescriptions     Medication Sig Dispense Auth. Provider   metroNIDAZOLE  (FLAGYL ) 500 MG tablet Take 1 tablet (500 mg total) by mouth 2 (two) times daily for 7 days. 14 tablet Riot Barrick, Asberry, PA-C      PDMP not reviewed this encounter.   Jeryl Asberry, PA-C 05/01/24 1259

## 2024-05-01 NOTE — Discharge Instructions (Addendum)
 We will call you if anything on your swab returns positive. You can also see these results on MyChart. Please abstain from sexual intercourse until your results return.  In the meantime I am treating your for BV Take the Flagyl  twice daily for 7 days Take with food to avoid upset stomach. We will call you if anything needs to be adjusted based on your swab results

## 2024-05-01 NOTE — ED Triage Notes (Signed)
 Pt presents c/o frequent urination and white vaginal discharge x 3 days. Pt reports the sxs she is having feels similar to having BV previously.

## 2024-05-02 ENCOUNTER — Ambulatory Visit (HOSPITAL_COMMUNITY): Payer: Self-pay

## 2024-05-02 LAB — CERVICOVAGINAL ANCILLARY ONLY
Bacterial Vaginitis (gardnerella): POSITIVE — AB
Candida Glabrata: NEGATIVE
Candida Vaginitis: NEGATIVE
Chlamydia: POSITIVE — AB
Comment: NEGATIVE
Comment: NEGATIVE
Comment: NEGATIVE
Comment: NEGATIVE
Comment: NEGATIVE
Comment: NORMAL
Neisseria Gonorrhea: NEGATIVE
Trichomonas: NEGATIVE

## 2024-05-02 MED ORDER — METRONIDAZOLE 0.75 % VA GEL: 1.0000 | Freq: Every day | VAGINAL | 0 refills | Status: AC

## 2024-05-02 MED ORDER — DOXYCYCLINE HYCLATE 100 MG PO TABS: 100.0000 mg | ORAL_TABLET | Freq: Two times a day (BID) | ORAL | 0 refills | Status: AC

## 2024-07-31 ENCOUNTER — Telehealth: Admitting: Physician Assistant

## 2024-07-31 DIAGNOSIS — B9689 Other specified bacterial agents as the cause of diseases classified elsewhere: Secondary | ICD-10-CM

## 2024-07-31 DIAGNOSIS — J208 Acute bronchitis due to other specified organisms: Secondary | ICD-10-CM

## 2024-08-01 MED ORDER — AZITHROMYCIN 250 MG PO TABS: ORAL_TABLET | ORAL | 0 refills | Status: AC

## 2024-08-01 MED ORDER — BENZONATATE 100 MG PO CAPS: 100.0000 mg | ORAL_CAPSULE | Freq: Three times a day (TID) | ORAL | 0 refills | Status: AC | PRN

## 2024-08-01 NOTE — Progress Notes (Signed)
 We are sorry that you are not feeling well.  Here is how we plan to help!  Based on your presentation I believe you most likely have A cough due to bacteria.  When patients have a fever and a productive cough with a change in color or increased sputum production, we are concerned about bacterial bronchitis.  If left untreated it can progress to pneumonia.  If your symptoms do not improve with your treatment plan it is important that you contact your provider.   I have prescribed Azithromyin 250 mg: two tablets now and then one tablet daily for 4 additonal days    In addition you may use A prescription cough medication called Tessalon  Perles 100mg . You may take 1-2 capsules every 8 hours as needed for your cough. Can be used together with OTC cough medications.  From your responses in the eVisit questionnaire you describe inflammation in the upper respiratory tract which is causing a significant cough.  This is commonly called Bronchitis and has four common causes:   Allergies Viral Infections Acid Reflux Bacterial Infection Allergies, viruses and acid reflux are treated by controlling symptoms or eliminating the cause. An example might be a cough caused by taking certain blood pressure medications. You stop the cough by changing the medication. Another example might be a cough caused by acid reflux. Controlling the reflux helps control the cough.  USE OF BRONCHODILATOR (RESCUE) INHALERS: There is a risk from using your bronchodilator too frequently.  The risk is that over-reliance on a medication which only relaxes the muscles surrounding the breathing tubes can reduce the effectiveness of medications prescribed to reduce swelling and congestion of the tubes themselves.  Although you feel brief relief from the bronchodilator inhaler, your asthma may actually be worsening with the tubes becoming more swollen and filled with mucus.  This can delay other crucial treatments, such as oral steroid  medications. If you need to use a bronchodilator inhaler daily, several times per day, you should discuss this with your provider.  There are probably better treatments that could be used to keep your asthma under control.     HOME CARE Only take medications as instructed by your medical team. Complete the entire course of an antibiotic. Drink plenty of fluids and get plenty of rest. Avoid close contacts especially the very young and the elderly Cover your mouth if you cough or cough into your sleeve. Always remember to wash your hands A steam or ultrasonic humidifier can help congestion.   GET HELP RIGHT AWAY IF: You develop worsening fever. You become short of breath You cough up blood. Your symptoms persist after you have completed your treatment plan MAKE SURE YOU  Understand these instructions. Will watch your condition. Will get help right away if you are not doing well or get worse.  Your e-visit answers were reviewed by a board certified advanced clinical practitioner to complete your personal care plan.  Depending on the condition, your plan could have included both over the counter or prescription medications. If there is a problem please reply  once you have received a response from your provider. Your safety is important to us .  If you have drug allergies check your prescription carefully.    You can use MyChart to ask questions about todays visit, request a non-urgent call back, or ask for a work or school excuse for 24 hours related to this e-Visit. If it has been greater than 24 hours you will need to follow up with  your provider, or enter a new e-Visit to address those concerns. You will get an e-mail in the next two days asking about your experience.  I hope that your e-visit has been valuable and will speed your recovery. Thank you for using e-visits.   I have spent 5 minutes in review of e-visit questionnaire, review and updating patient chart, medical decision making  and response to patient.   Delon CHRISTELLA Dickinson, PA-C
